# Patient Record
Sex: Female | Born: 1995 | Race: White | Hispanic: No | Marital: Single | Smoking: Never smoker
Health system: Southern US, Community
[De-identification: ages and names within clinical notes are randomized; demographics above are authoritative.]

## PROBLEM LIST (undated history)

## (undated) DIAGNOSIS — F429 Obsessive-compulsive disorder, unspecified: Secondary | ICD-10-CM

## (undated) DIAGNOSIS — D509 Iron deficiency anemia, unspecified: Secondary | ICD-10-CM

## (undated) DIAGNOSIS — F419 Anxiety disorder, unspecified: Secondary | ICD-10-CM

## (undated) DIAGNOSIS — E538 Deficiency of other specified B group vitamins: Secondary | ICD-10-CM

## (undated) DIAGNOSIS — G43109 Migraine with aura, not intractable, without status migrainosus: Secondary | ICD-10-CM

## (undated) DIAGNOSIS — C439 Malignant melanoma of skin, unspecified: Secondary | ICD-10-CM

## (undated) DIAGNOSIS — T7840XA Allergy, unspecified, initial encounter: Secondary | ICD-10-CM

## (undated) HISTORY — DX: Anxiety disorder, unspecified: F41.9

## (undated) HISTORY — DX: Migraine with aura, not intractable, without status migrainosus: G43.109

## (undated) HISTORY — DX: Obsessive-compulsive disorder, unspecified: F42.9

## (undated) HISTORY — DX: Allergy, unspecified, initial encounter: T78.40XA

## (undated) HISTORY — DX: Deficiency of other specified B group vitamins: E53.8

## (undated) HISTORY — DX: Iron deficiency anemia, unspecified: D50.9

---
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
## 2015-01-16 IMAGING — CR DG ABDOMEN 3V
1 series · 3 of 3 positions shown · non-contrast
Comparison: none

REASON FOR EXAM: nausea with eating
COMMENTS:

PROCEDURE:     DXR - DXR ABDOMEN COMPLETE  - September 02, 2013  [DATE]
RESULT:     Bowel gas pattern appears normal. No nephrolithiasis is
appreciated. There is no free air.

[Series 1: w abdomen upright · 0.14mm/px · 3 of 3 slices shown]
[im 1/3]
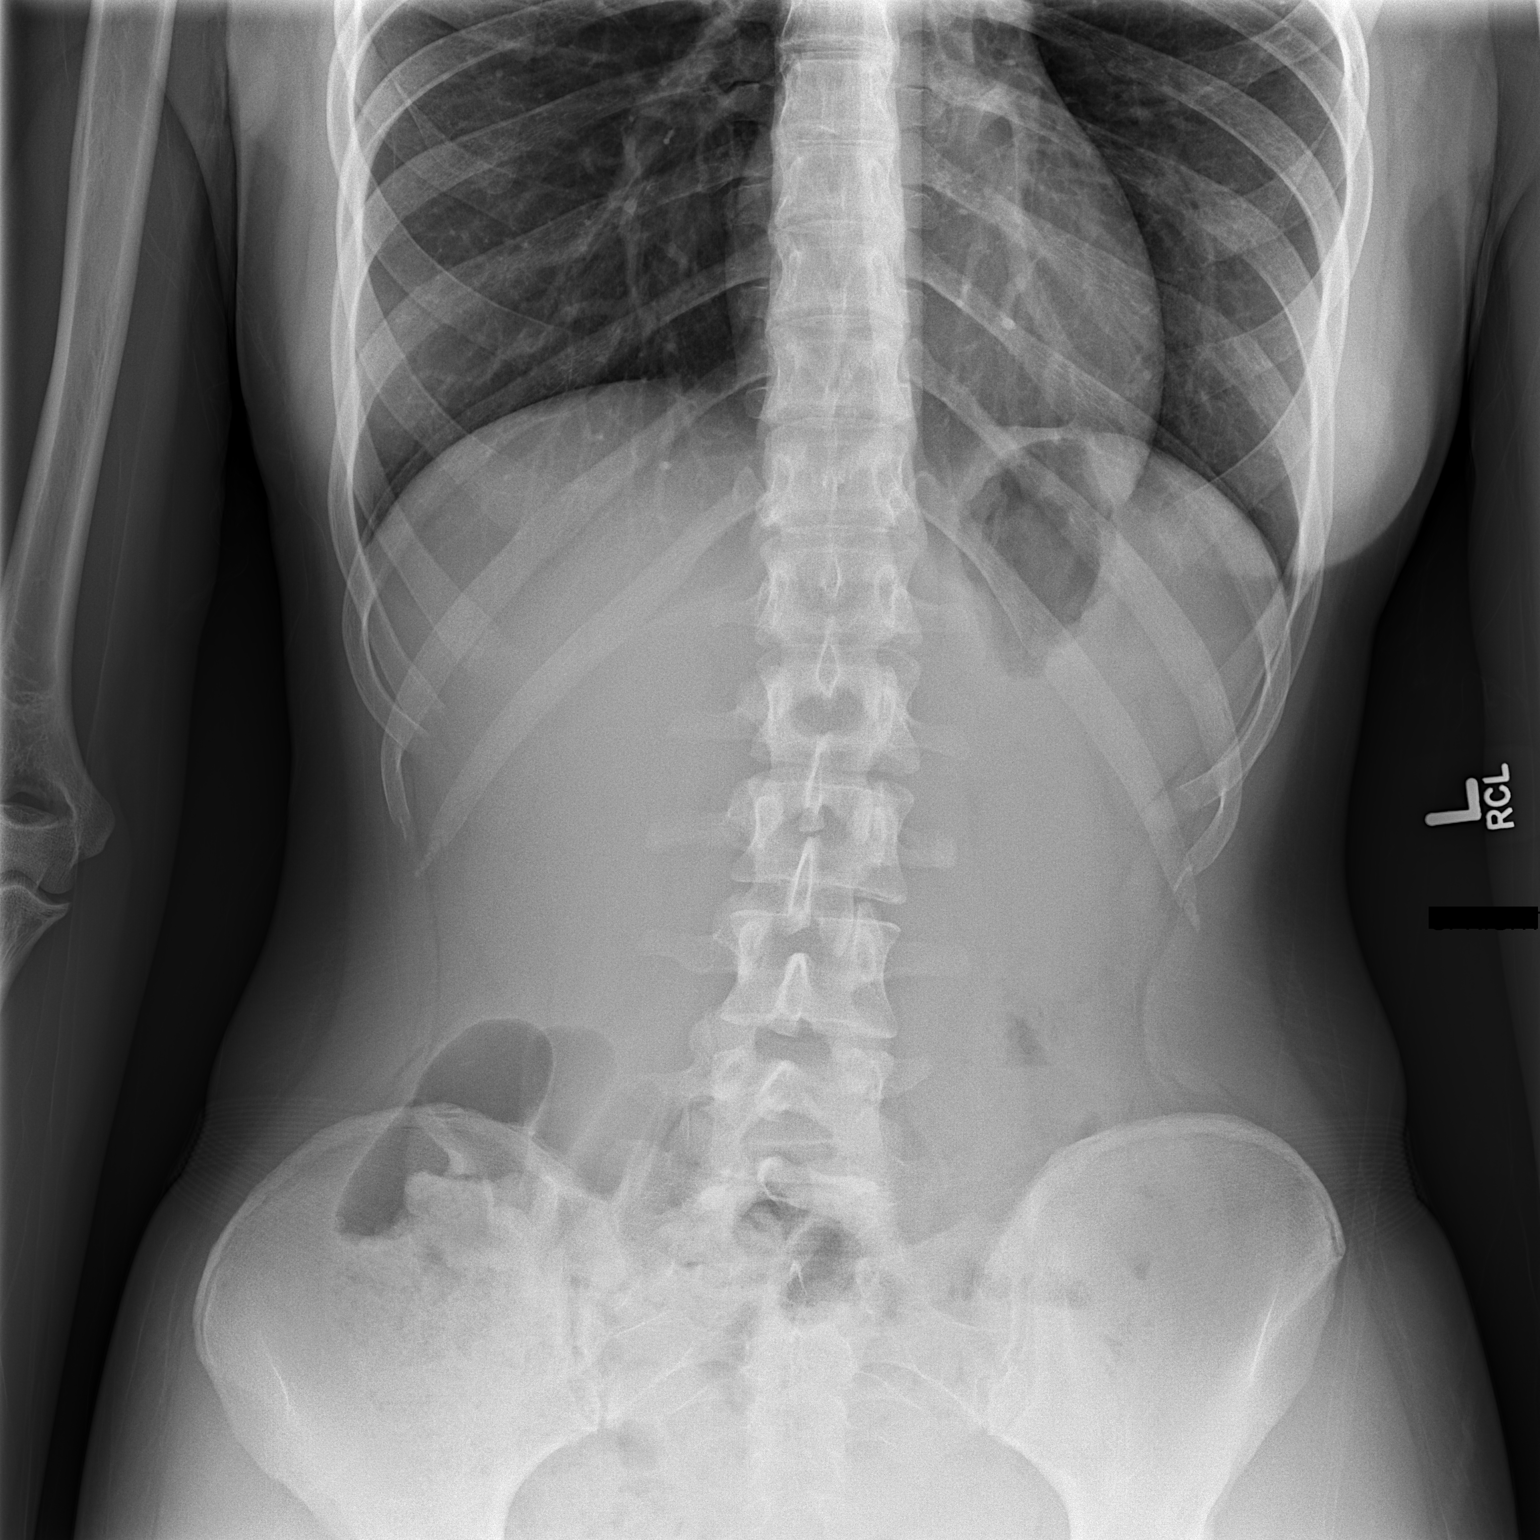
[im 2/3]
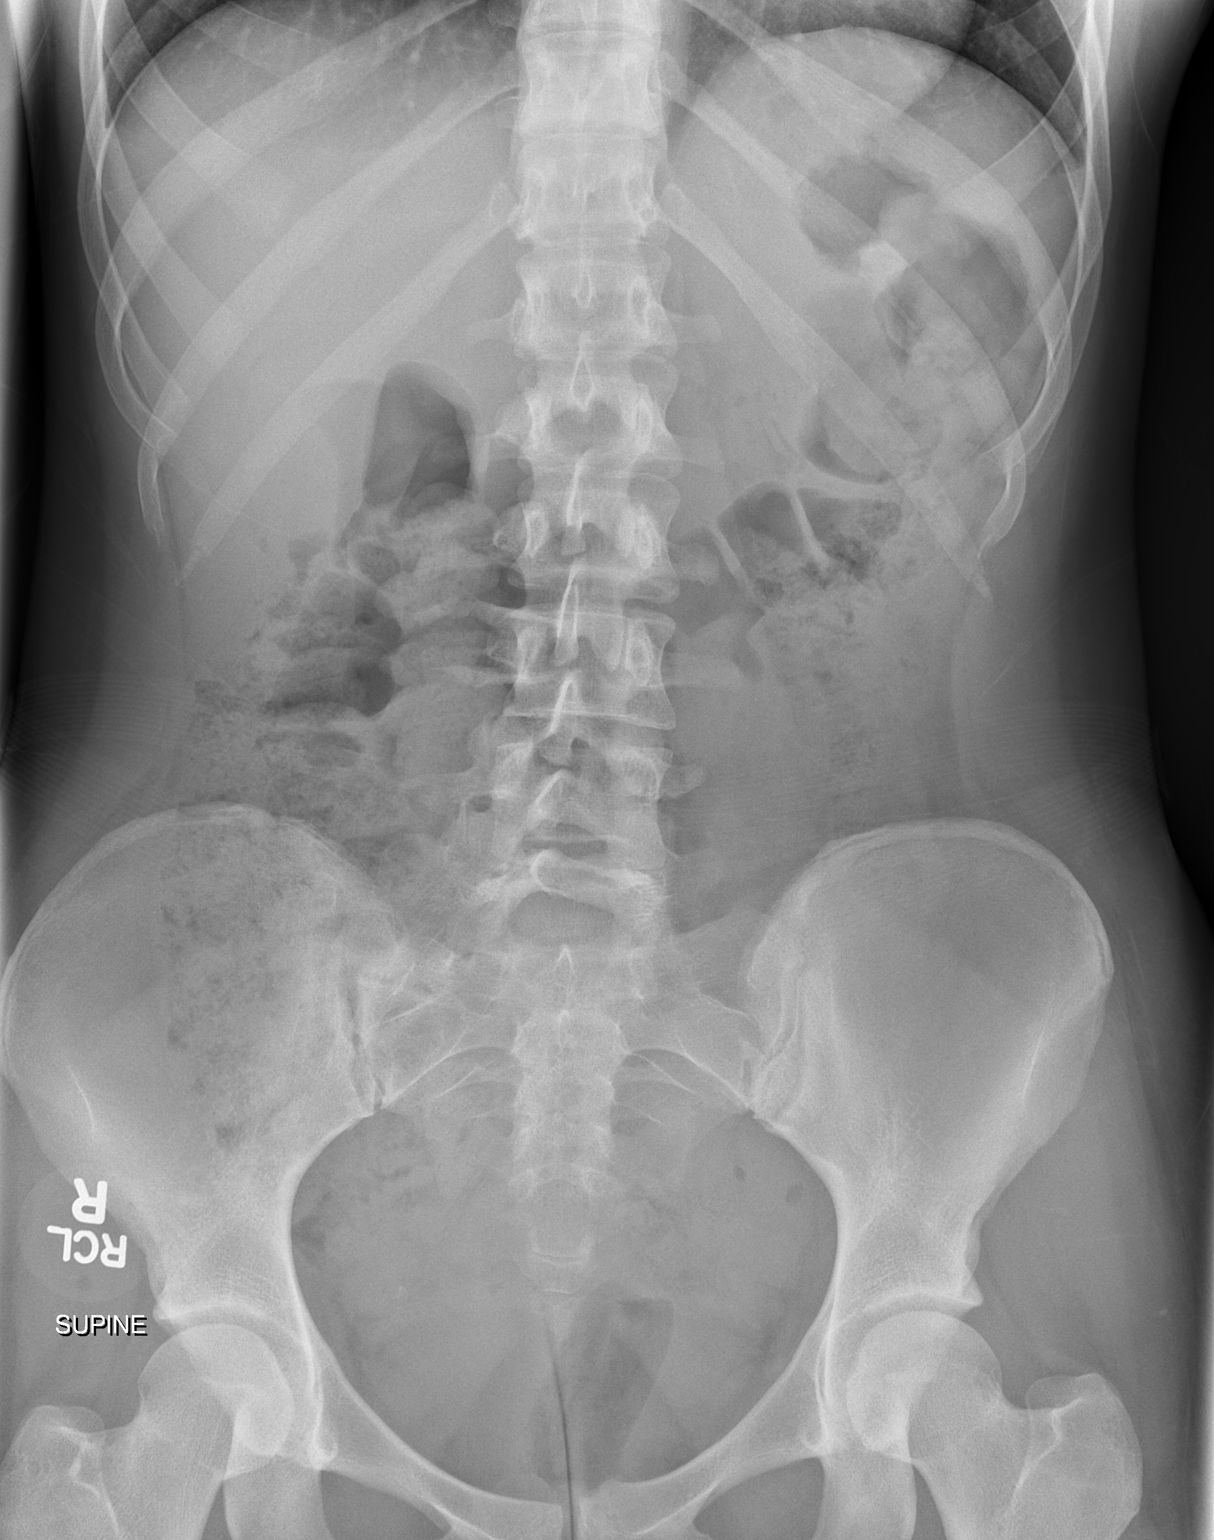
[im 3/3]
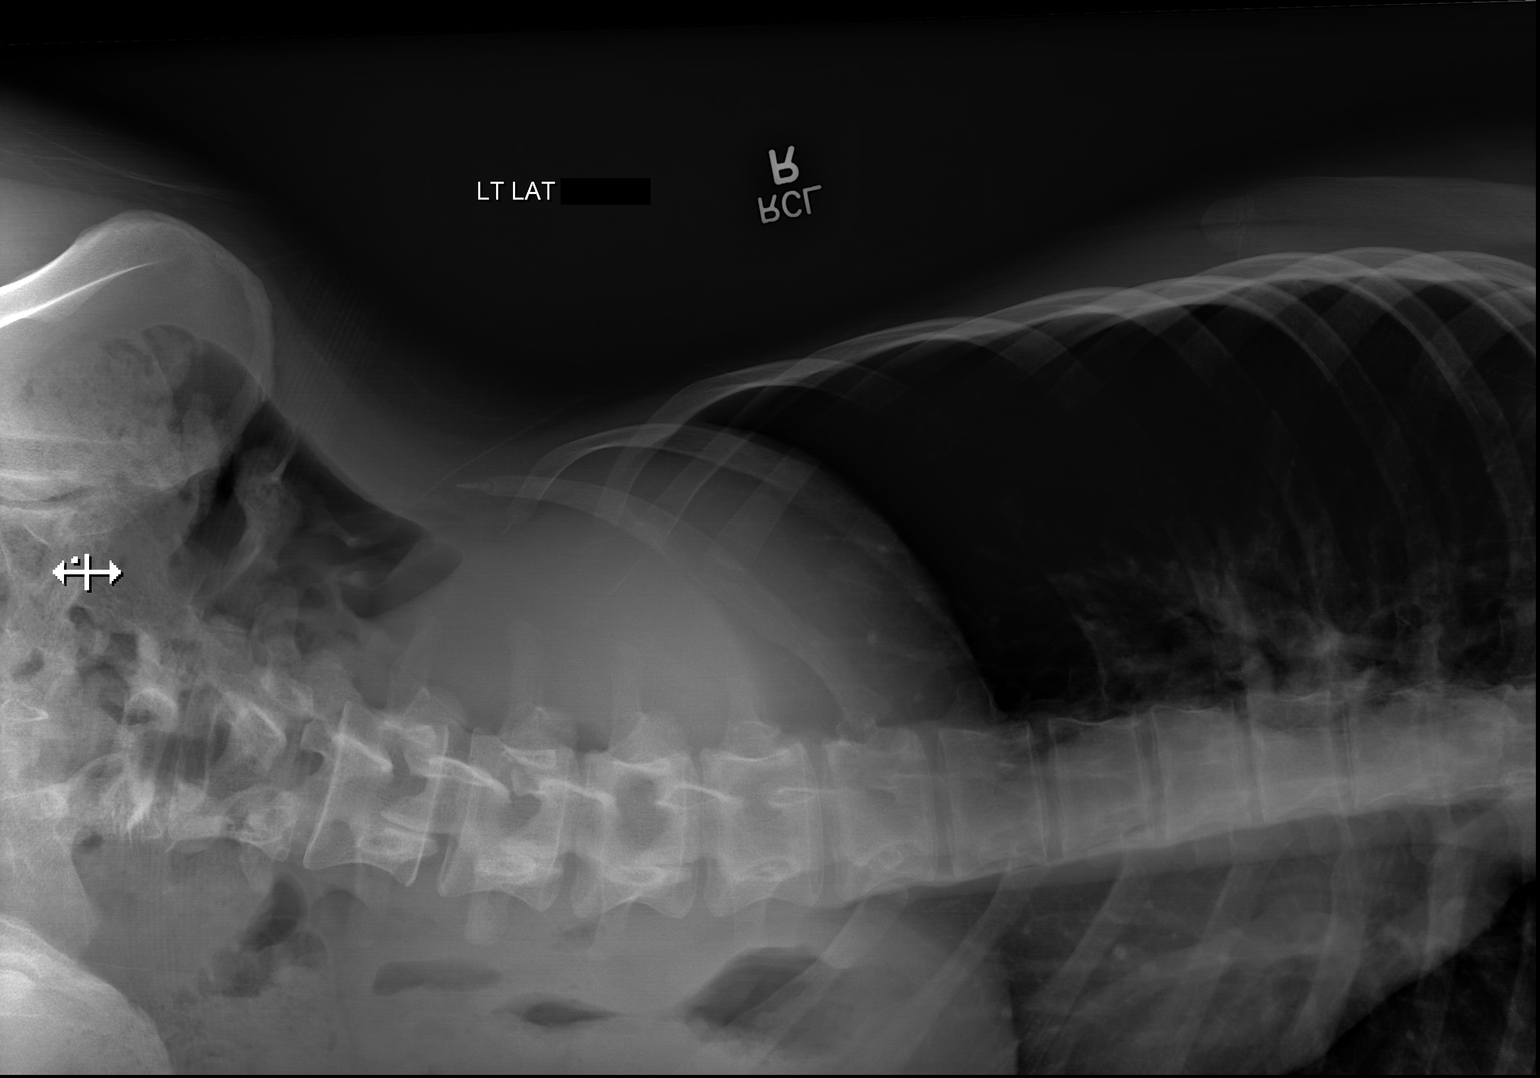

[3 of 3 positions shown; findings below may reference images not displayed]

IMPRESSION: No findings of bowel obstruction or perforation. The lung
bases are clear.

[REDACTED]

## 2016-10-10 DIAGNOSIS — L700 Acne vulgaris: Secondary | ICD-10-CM | POA: Diagnosis not present

## 2016-10-21 DIAGNOSIS — J029 Acute pharyngitis, unspecified: Secondary | ICD-10-CM | POA: Diagnosis not present

## 2016-10-21 DIAGNOSIS — J020 Streptococcal pharyngitis: Secondary | ICD-10-CM | POA: Diagnosis not present

## 2016-10-21 DIAGNOSIS — J019 Acute sinusitis, unspecified: Secondary | ICD-10-CM | POA: Diagnosis not present

## 2016-11-17 DIAGNOSIS — Z3042 Encounter for surveillance of injectable contraceptive: Secondary | ICD-10-CM | POA: Diagnosis not present

## 2017-01-15 DIAGNOSIS — M5413 Radiculopathy, cervicothoracic region: Secondary | ICD-10-CM | POA: Diagnosis not present

## 2017-01-15 DIAGNOSIS — M62838 Other muscle spasm: Secondary | ICD-10-CM | POA: Diagnosis not present

## 2017-01-15 DIAGNOSIS — M9901 Segmental and somatic dysfunction of cervical region: Secondary | ICD-10-CM | POA: Diagnosis not present

## 2017-01-16 DIAGNOSIS — M9901 Segmental and somatic dysfunction of cervical region: Secondary | ICD-10-CM | POA: Diagnosis not present

## 2017-01-16 DIAGNOSIS — M5413 Radiculopathy, cervicothoracic region: Secondary | ICD-10-CM | POA: Diagnosis not present

## 2017-01-16 DIAGNOSIS — M62838 Other muscle spasm: Secondary | ICD-10-CM | POA: Diagnosis not present

## 2017-01-19 DIAGNOSIS — M5413 Radiculopathy, cervicothoracic region: Secondary | ICD-10-CM | POA: Diagnosis not present

## 2017-01-19 DIAGNOSIS — M9901 Segmental and somatic dysfunction of cervical region: Secondary | ICD-10-CM | POA: Diagnosis not present

## 2017-01-19 DIAGNOSIS — M62838 Other muscle spasm: Secondary | ICD-10-CM | POA: Diagnosis not present

## 2017-01-21 DIAGNOSIS — M9901 Segmental and somatic dysfunction of cervical region: Secondary | ICD-10-CM | POA: Diagnosis not present

## 2017-01-21 DIAGNOSIS — M62838 Other muscle spasm: Secondary | ICD-10-CM | POA: Diagnosis not present

## 2017-01-21 DIAGNOSIS — M5413 Radiculopathy, cervicothoracic region: Secondary | ICD-10-CM | POA: Diagnosis not present

## 2017-02-03 DIAGNOSIS — J111 Influenza due to unidentified influenza virus with other respiratory manifestations: Secondary | ICD-10-CM | POA: Diagnosis not present

## 2017-03-19 DIAGNOSIS — R300 Dysuria: Secondary | ICD-10-CM | POA: Diagnosis not present

## 2017-05-12 DIAGNOSIS — Z30018 Encounter for initial prescription of other contraceptives: Secondary | ICD-10-CM | POA: Diagnosis not present

## 2017-05-14 DIAGNOSIS — J200 Acute bronchitis due to Mycoplasma pneumoniae: Secondary | ICD-10-CM | POA: Diagnosis not present

## 2017-05-14 DIAGNOSIS — J029 Acute pharyngitis, unspecified: Secondary | ICD-10-CM | POA: Diagnosis not present

## 2017-05-22 DIAGNOSIS — J019 Acute sinusitis, unspecified: Secondary | ICD-10-CM | POA: Diagnosis not present

## 2017-12-01 DIAGNOSIS — B001 Herpesviral vesicular dermatitis: Secondary | ICD-10-CM | POA: Diagnosis not present

## 2017-12-04 DIAGNOSIS — L700 Acne vulgaris: Secondary | ICD-10-CM | POA: Diagnosis not present

## 2017-12-10 DIAGNOSIS — Z6825 Body mass index (BMI) 25.0-25.9, adult: Secondary | ICD-10-CM | POA: Diagnosis not present

## 2017-12-10 DIAGNOSIS — Z118 Encounter for screening for other infectious and parasitic diseases: Secondary | ICD-10-CM | POA: Diagnosis not present

## 2017-12-10 DIAGNOSIS — Z01419 Encounter for gynecological examination (general) (routine) without abnormal findings: Secondary | ICD-10-CM | POA: Diagnosis not present

## 2017-12-10 DIAGNOSIS — Z23 Encounter for immunization: Secondary | ICD-10-CM | POA: Diagnosis not present

## 2018-05-18 DIAGNOSIS — D2339 Other benign neoplasm of skin of other parts of face: Secondary | ICD-10-CM | POA: Diagnosis not present

## 2018-05-18 DIAGNOSIS — S60031A Contusion of right middle finger without damage to nail, initial encounter: Secondary | ICD-10-CM | POA: Diagnosis not present

## 2018-05-18 DIAGNOSIS — L728 Other follicular cysts of the skin and subcutaneous tissue: Secondary | ICD-10-CM | POA: Diagnosis not present

## 2018-12-14 DIAGNOSIS — Z6823 Body mass index (BMI) 23.0-23.9, adult: Secondary | ICD-10-CM | POA: Diagnosis not present

## 2018-12-14 DIAGNOSIS — Z118 Encounter for screening for other infectious and parasitic diseases: Secondary | ICD-10-CM | POA: Diagnosis not present

## 2018-12-14 DIAGNOSIS — Z01419 Encounter for gynecological examination (general) (routine) without abnormal findings: Secondary | ICD-10-CM | POA: Diagnosis not present

## 2018-12-15 DIAGNOSIS — D485 Neoplasm of uncertain behavior of skin: Secondary | ICD-10-CM | POA: Diagnosis not present

## 2018-12-15 DIAGNOSIS — L700 Acne vulgaris: Secondary | ICD-10-CM | POA: Diagnosis not present

## 2018-12-15 DIAGNOSIS — R208 Other disturbances of skin sensation: Secondary | ICD-10-CM | POA: Diagnosis not present

## 2018-12-15 DIAGNOSIS — D0372 Melanoma in situ of left lower limb, including hip: Secondary | ICD-10-CM | POA: Diagnosis not present

## 2018-12-15 DIAGNOSIS — D2272 Melanocytic nevi of left lower limb, including hip: Secondary | ICD-10-CM | POA: Diagnosis not present

## 2019-01-26 DIAGNOSIS — D0372 Melanoma in situ of left lower limb, including hip: Secondary | ICD-10-CM | POA: Diagnosis not present

## 2019-04-19 DIAGNOSIS — D225 Melanocytic nevi of trunk: Secondary | ICD-10-CM | POA: Diagnosis not present

## 2019-04-19 DIAGNOSIS — S90112A Contusion of left great toe without damage to nail, initial encounter: Secondary | ICD-10-CM | POA: Diagnosis not present

## 2019-05-11 DIAGNOSIS — B360 Pityriasis versicolor: Secondary | ICD-10-CM | POA: Diagnosis not present

## 2019-05-11 DIAGNOSIS — Z08 Encounter for follow-up examination after completed treatment for malignant neoplasm: Secondary | ICD-10-CM | POA: Diagnosis not present

## 2019-05-11 DIAGNOSIS — Z85820 Personal history of malignant melanoma of skin: Secondary | ICD-10-CM | POA: Diagnosis not present

## 2019-05-11 DIAGNOSIS — D485 Neoplasm of uncertain behavior of skin: Secondary | ICD-10-CM | POA: Diagnosis not present

## 2019-05-11 DIAGNOSIS — D225 Melanocytic nevi of trunk: Secondary | ICD-10-CM | POA: Diagnosis not present

## 2019-05-19 DIAGNOSIS — F411 Generalized anxiety disorder: Secondary | ICD-10-CM | POA: Diagnosis not present

## 2019-05-24 DIAGNOSIS — F411 Generalized anxiety disorder: Secondary | ICD-10-CM | POA: Diagnosis not present

## 2019-05-25 DIAGNOSIS — D485 Neoplasm of uncertain behavior of skin: Secondary | ICD-10-CM | POA: Diagnosis not present

## 2019-06-02 DIAGNOSIS — F411 Generalized anxiety disorder: Secondary | ICD-10-CM | POA: Diagnosis not present

## 2019-06-21 DIAGNOSIS — F411 Generalized anxiety disorder: Secondary | ICD-10-CM | POA: Diagnosis not present

## 2019-06-22 DIAGNOSIS — Z6823 Body mass index (BMI) 23.0-23.9, adult: Secondary | ICD-10-CM | POA: Diagnosis not present

## 2019-06-22 DIAGNOSIS — Z713 Dietary counseling and surveillance: Secondary | ICD-10-CM | POA: Diagnosis not present

## 2019-06-22 DIAGNOSIS — Z0000 Encounter for general adult medical examination without abnormal findings: Secondary | ICD-10-CM | POA: Diagnosis not present

## 2019-06-22 DIAGNOSIS — Z23 Encounter for immunization: Secondary | ICD-10-CM | POA: Diagnosis not present

## 2019-06-28 DIAGNOSIS — Z23 Encounter for immunization: Secondary | ICD-10-CM | POA: Diagnosis not present

## 2019-11-14 ENCOUNTER — Other Ambulatory Visit: Payer: Self-pay

## 2019-11-14 DIAGNOSIS — Z20822 Contact with and (suspected) exposure to covid-19: Secondary | ICD-10-CM

## 2019-11-16 LAB — NOVEL CORONAVIRUS, NAA: SARS-CoV-2, NAA: NOT DETECTED

## 2020-05-01 DIAGNOSIS — F429 Obsessive-compulsive disorder, unspecified: Secondary | ICD-10-CM | POA: Diagnosis not present

## 2020-05-07 DIAGNOSIS — F422 Mixed obsessional thoughts and acts: Secondary | ICD-10-CM | POA: Diagnosis not present

## 2020-06-05 DIAGNOSIS — F429 Obsessive-compulsive disorder, unspecified: Secondary | ICD-10-CM | POA: Diagnosis not present

## 2020-06-19 DIAGNOSIS — Z111 Encounter for screening for respiratory tuberculosis: Secondary | ICD-10-CM | POA: Diagnosis not present

## 2020-06-21 DIAGNOSIS — Z111 Encounter for screening for respiratory tuberculosis: Secondary | ICD-10-CM | POA: Diagnosis not present

## 2020-06-22 DIAGNOSIS — L700 Acne vulgaris: Secondary | ICD-10-CM | POA: Diagnosis not present

## 2020-06-22 DIAGNOSIS — D225 Melanocytic nevi of trunk: Secondary | ICD-10-CM | POA: Diagnosis not present

## 2020-06-22 DIAGNOSIS — D2262 Melanocytic nevi of left upper limb, including shoulder: Secondary | ICD-10-CM | POA: Diagnosis not present

## 2020-06-22 DIAGNOSIS — Z85820 Personal history of malignant melanoma of skin: Secondary | ICD-10-CM | POA: Diagnosis not present

## 2020-06-26 DIAGNOSIS — Z111 Encounter for screening for respiratory tuberculosis: Secondary | ICD-10-CM | POA: Diagnosis not present

## 2020-06-27 DIAGNOSIS — F429 Obsessive-compulsive disorder, unspecified: Secondary | ICD-10-CM | POA: Diagnosis not present

## 2020-06-28 DIAGNOSIS — Z111 Encounter for screening for respiratory tuberculosis: Secondary | ICD-10-CM | POA: Diagnosis not present

## 2020-07-07 DIAGNOSIS — Z8659 Personal history of other mental and behavioral disorders: Secondary | ICD-10-CM | POA: Diagnosis not present

## 2020-07-07 DIAGNOSIS — R5383 Other fatigue: Secondary | ICD-10-CM | POA: Diagnosis not present

## 2020-07-20 DIAGNOSIS — H5213 Myopia, bilateral: Secondary | ICD-10-CM | POA: Diagnosis not present

## 2020-07-25 DIAGNOSIS — F422 Mixed obsessional thoughts and acts: Secondary | ICD-10-CM | POA: Diagnosis not present

## 2020-09-11 DIAGNOSIS — F422 Mixed obsessional thoughts and acts: Secondary | ICD-10-CM | POA: Diagnosis not present

## 2020-10-08 DIAGNOSIS — F429 Obsessive-compulsive disorder, unspecified: Secondary | ICD-10-CM | POA: Diagnosis not present

## 2020-10-15 DIAGNOSIS — Z23 Encounter for immunization: Secondary | ICD-10-CM | POA: Diagnosis not present

## 2020-10-18 DIAGNOSIS — F422 Mixed obsessional thoughts and acts: Secondary | ICD-10-CM | POA: Diagnosis not present

## 2020-11-08 DIAGNOSIS — F429 Obsessive-compulsive disorder, unspecified: Secondary | ICD-10-CM | POA: Diagnosis not present

## 2020-11-19 DIAGNOSIS — F422 Mixed obsessional thoughts and acts: Secondary | ICD-10-CM | POA: Diagnosis not present

## 2020-12-03 DIAGNOSIS — F429 Obsessive-compulsive disorder, unspecified: Secondary | ICD-10-CM | POA: Diagnosis not present

## 2020-12-13 DIAGNOSIS — F422 Mixed obsessional thoughts and acts: Secondary | ICD-10-CM | POA: Diagnosis not present

## 2020-12-18 DIAGNOSIS — Z01419 Encounter for gynecological examination (general) (routine) without abnormal findings: Secondary | ICD-10-CM | POA: Diagnosis not present

## 2020-12-18 DIAGNOSIS — Z6827 Body mass index (BMI) 27.0-27.9, adult: Secondary | ICD-10-CM | POA: Diagnosis not present

## 2020-12-18 DIAGNOSIS — Z124 Encounter for screening for malignant neoplasm of cervix: Secondary | ICD-10-CM | POA: Diagnosis not present

## 2021-01-04 DIAGNOSIS — Z85820 Personal history of malignant melanoma of skin: Secondary | ICD-10-CM | POA: Diagnosis not present

## 2021-01-04 DIAGNOSIS — B001 Herpesviral vesicular dermatitis: Secondary | ICD-10-CM | POA: Diagnosis not present

## 2021-01-04 DIAGNOSIS — D225 Melanocytic nevi of trunk: Secondary | ICD-10-CM | POA: Diagnosis not present

## 2021-01-04 DIAGNOSIS — L700 Acne vulgaris: Secondary | ICD-10-CM | POA: Diagnosis not present

## 2021-01-04 LAB — HM PAP SMEAR

## 2021-01-21 DIAGNOSIS — F422 Mixed obsessional thoughts and acts: Secondary | ICD-10-CM | POA: Diagnosis not present

## 2021-01-29 DIAGNOSIS — F429 Obsessive-compulsive disorder, unspecified: Secondary | ICD-10-CM | POA: Diagnosis not present

## 2021-03-06 DIAGNOSIS — F429 Obsessive-compulsive disorder, unspecified: Secondary | ICD-10-CM | POA: Diagnosis not present

## 2021-03-07 DIAGNOSIS — F422 Mixed obsessional thoughts and acts: Secondary | ICD-10-CM | POA: Diagnosis not present

## 2021-04-29 DIAGNOSIS — F429 Obsessive-compulsive disorder, unspecified: Secondary | ICD-10-CM | POA: Diagnosis not present

## 2021-05-02 DIAGNOSIS — F422 Mixed obsessional thoughts and acts: Secondary | ICD-10-CM | POA: Diagnosis not present

## 2021-06-25 ENCOUNTER — Ambulatory Visit
Admission: EM | Admit: 2021-06-25 | Discharge: 2021-06-25 | Disposition: A | Discharge status: Home / Self Care | Payer: BC Managed Care – PPO | Attending: Emergency Medicine | Admitting: Emergency Medicine

## 2021-06-25 ENCOUNTER — Other Ambulatory Visit: Payer: Self-pay

## 2021-06-25 ENCOUNTER — Encounter: Payer: Self-pay | Admitting: Emergency Medicine

## 2021-06-25 DIAGNOSIS — J029 Acute pharyngitis, unspecified: Secondary | ICD-10-CM | POA: Diagnosis not present

## 2021-06-25 DIAGNOSIS — J069 Acute upper respiratory infection, unspecified: Secondary | ICD-10-CM | POA: Diagnosis not present

## 2021-06-25 HISTORY — DX: Malignant melanoma of skin, unspecified: C43.9

## 2021-06-25 LAB — POCT RAPID STREP A (OFFICE): Rapid Strep A Screen: NEGATIVE

## 2021-06-25 NOTE — ED Triage Notes (Signed)
Pt presents today with c/o of headache, post nasal drip, sore throat x 3 days, no fever.

## 2021-06-25 NOTE — Discharge Instructions (Addendum)
Your rapid strep test is negative.  A throat culture is pending; we will call you if it is positive requiring treatment.    Your COVID test is pending.  You should self quarantine until the test result is back.    Take Tylenol or ibuprofen as needed for fever or discomfort.  Rest and keep yourself hydrated.    Follow-up with your primary care provider if your symptoms are not improving.

## 2021-06-25 NOTE — ED Provider Notes (Signed)
Roderic Palau    CSN: [id number] Arrival date & time: 06/25/21  1342      History   Chief Complaint Chief Complaint  Patient presents with   Otalgia   Headache   Sore Throat   Nasal Congestion    HPI Linda Chung is a 25 y.o. female.  Patient presents with 3-day history of headache, sore throat, postnasal drip, congestion.  She denies fever, chills, cough, shortness of breath, vomiting, diarrhea, or other symptoms.  OTC treatment attempted at home.  No pertinent medical history.  The history is provided by the patient.   Past Medical History:  Diagnosis Date   Melanoma ([place])     There are no problems to display for this patient.   History reviewed. No pertinent surgical history.  OB History   No obstetric history on file.      Home Medications    Prior to Admission medications   Medication Sig Start Date End Date Taking? Authorizing Provider  NEOMYCIN-POLYMYXIN-HYDROCORTISONE (CORTISPORIN) 1 % SOLN OTIC solution  09/06/10  Yes [provider]  acyclovir (ZOVIRAX) 400 MG tablet Take 400 mg by mouth 2 (two) times daily. 04/09/21   [provider]  sertraline (ZOLOFT) 50 MG tablet Take 50 mg by mouth daily. 06/06/21   [provider]  spironolactone (ALDACTONE) 50 MG tablet Take 50 mg by mouth 2 (two) times daily. 06/06/21   [provider]    Family History History reviewed. No pertinent family history.  Social History Social History   Tobacco Use   Smoking status: Never   Smokeless tobacco: Never  Vaping Use   Vaping Use: Never used  Substance Use Topics   Alcohol use: Not Currently    Comment: occ   Drug use: Not Currently     Allergies   Sulfa antibiotics   Review of Systems Review of Systems  Constitutional:  Negative for chills and fever.  HENT:  Positive for postnasal drip and sore throat. Negative for ear pain.   Respiratory:  Positive for cough. Negative for shortness of breath.    Cardiovascular:  Negative for chest pain and palpitations.  Gastrointestinal:  Negative for abdominal pain and vomiting.  Skin:  Negative for color change and rash.  Neurological:  Positive for headaches. Negative for syncope.  All other systems reviewed and are negative.   Physical Exam Triage Vital Signs ED Triage Vitals  Enc Vitals Group     BP      Pulse      Resp      Temp      Temp src      SpO2      Weight      Height      Head Circumference      Peak Flow      Pain Score      Pain Loc      Pain Edu?      Excl. in [place]?    No data found.  Updated Vital Signs BP 121/82 (BP Location: Right Arm)   Pulse 91   Temp 99.3 F (37.4 C) (Oral)   Resp 16   Ht 5\' 2"  (1.575 m)   Wt 150 lb (68 kg)   SpO2 99%   BMI 27.44 kg/m   Visual Acuity Right Eye Distance:   Left Eye Distance:   Bilateral Distance:    Right Eye Near:   Left Eye Near:    Bilateral Near:     Physical  Exam Vitals and nursing note reviewed.  Constitutional:      General: She is not in acute distress.    Appearance: She is well-developed. She is not ill-appearing.  HENT:     Head: Normocephalic and atraumatic.     Right Ear: Tympanic membrane normal.     Left Ear: Tympanic membrane normal.     Nose: Nose normal.     Mouth/Throat:     Mouth: Mucous membranes are moist.     Pharynx: Posterior oropharyngeal erythema present.  Eyes:     Conjunctiva/sclera: Conjunctivae normal.  Cardiovascular:     Rate and Rhythm: Normal rate and regular rhythm.     Heart sounds: Normal heart sounds.  Pulmonary:     Effort: Pulmonary effort is normal. No respiratory distress.     Breath sounds: Normal breath sounds.  Abdominal:     Palpations: Abdomen is soft.     Tenderness: There is no abdominal tenderness.  Musculoskeletal:     Cervical back: Neck supple.  Skin:    General: Skin is warm and dry.  Neurological:     General: No focal deficit present.     Mental Status: She is alert and oriented to  person, place, and time.     Gait: Gait normal.  Psychiatric:        Mood and Affect: Mood normal.        Behavior: Behavior normal.     UC Treatments / Results  Labs (all labs ordered are listed, but only abnormal results are displayed) Labs Reviewed  NOVEL CORONAVIRUS, NAA  CULTURE, GROUP A STREP [facility])  POCT RAPID STREP A (OFFICE)    EKG   Radiology No results found.  Procedures Procedures (including critical care time)  Medications Ordered in UC Medications - No data to display  Initial Impression / Assessment and Plan / UC Course  I have reviewed the triage vital signs and the nursing notes.  Pertinent labs & imaging results that were available during my care of the patient were reviewed by me and considered in my medical decision making (see chart for details).   Viral URI, sore throat.  Rapid strep negative; culture pending.  COVID pending.  Instructed patient to self quarantine per CDC guidelines.  Discussed symptomatic treatment including Tylenol or ibuprofen, rest, hydration.  Instructed patient to follow up with PCP if symptoms are not improving.  Patient agrees to plan of care.    Final Clinical Impressions(s) / UC Diagnoses   Final diagnoses:  Viral URI  Sore throat     Discharge Instructions      Your rapid strep test is negative.  A throat culture is pending; we will call you if it is positive requiring treatment.    Your COVID test is pending.  You should self quarantine until the test result is back.    Take Tylenol or ibuprofen as needed for fever or discomfort.  Rest and keep yourself hydrated.    Follow-up with your primary care provider if your symptoms are not improving.         ED Prescriptions   None    PDMP not reviewed this encounter.   Sharion Balloon, NP 06/25/21 1434

## 2021-06-26 DIAGNOSIS — H5213 Myopia, bilateral: Secondary | ICD-10-CM | POA: Diagnosis not present

## 2021-06-26 LAB — NOVEL CORONAVIRUS, NAA: SARS-CoV-2, NAA: NOT DETECTED

## 2021-06-26 LAB — SARS-COV-2, NAA 2 DAY TAT

## 2021-06-28 LAB — CULTURE, GROUP A STREP (THRC)

## 2021-07-05 DIAGNOSIS — D2262 Melanocytic nevi of left upper limb, including shoulder: Secondary | ICD-10-CM | POA: Diagnosis not present

## 2021-07-05 DIAGNOSIS — L304 Erythema intertrigo: Secondary | ICD-10-CM | POA: Diagnosis not present

## 2021-07-05 DIAGNOSIS — D225 Melanocytic nevi of trunk: Secondary | ICD-10-CM | POA: Diagnosis not present

## 2021-07-05 DIAGNOSIS — Z85820 Personal history of malignant melanoma of skin: Secondary | ICD-10-CM | POA: Diagnosis not present

## 2021-07-06 DIAGNOSIS — E611 Iron deficiency: Secondary | ICD-10-CM | POA: Diagnosis not present

## 2021-07-06 DIAGNOSIS — F419 Anxiety disorder, unspecified: Secondary | ICD-10-CM | POA: Diagnosis not present

## 2021-07-06 DIAGNOSIS — E538 Deficiency of other specified B group vitamins: Secondary | ICD-10-CM | POA: Diagnosis not present

## 2021-07-12 DIAGNOSIS — H02052 Trichiasis without entropian right lower eyelid: Secondary | ICD-10-CM | POA: Diagnosis not present

## 2021-10-14 ENCOUNTER — Ambulatory Visit: Payer: BC Managed Care – PPO | Admitting: Internal Medicine

## 2021-11-24 DIAGNOSIS — R059 Cough, unspecified: Secondary | ICD-10-CM | POA: Diagnosis not present

## 2021-11-24 DIAGNOSIS — B349 Viral infection, unspecified: Secondary | ICD-10-CM | POA: Diagnosis not present

## 2021-11-24 DIAGNOSIS — Z20822 Contact with and (suspected) exposure to covid-19: Secondary | ICD-10-CM | POA: Diagnosis not present

## 2021-11-24 DIAGNOSIS — R070 Pain in throat: Secondary | ICD-10-CM | POA: Diagnosis not present

## 2021-11-29 DIAGNOSIS — J029 Acute pharyngitis, unspecified: Secondary | ICD-10-CM | POA: Diagnosis not present

## 2021-11-29 DIAGNOSIS — J069 Acute upper respiratory infection, unspecified: Secondary | ICD-10-CM | POA: Diagnosis not present

## 2021-12-03 ENCOUNTER — Ambulatory Visit: Payer: Self-pay | Admitting: Internal Medicine

## 2021-12-14 DIAGNOSIS — H6692 Otitis media, unspecified, left ear: Secondary | ICD-10-CM | POA: Diagnosis not present

## 2022-01-06 DIAGNOSIS — Z118 Encounter for screening for other infectious and parasitic diseases: Secondary | ICD-10-CM | POA: Diagnosis not present

## 2022-01-06 DIAGNOSIS — Z01419 Encounter for gynecological examination (general) (routine) without abnormal findings: Secondary | ICD-10-CM | POA: Diagnosis not present

## 2022-01-06 DIAGNOSIS — Z3041 Encounter for surveillance of contraceptive pills: Secondary | ICD-10-CM | POA: Diagnosis not present

## 2022-01-06 DIAGNOSIS — Z6828 Body mass index (BMI) 28.0-28.9, adult: Secondary | ICD-10-CM | POA: Diagnosis not present

## 2022-01-08 DIAGNOSIS — D225 Melanocytic nevi of trunk: Secondary | ICD-10-CM | POA: Diagnosis not present

## 2022-01-08 DIAGNOSIS — L700 Acne vulgaris: Secondary | ICD-10-CM | POA: Diagnosis not present

## 2022-01-08 DIAGNOSIS — Z85820 Personal history of malignant melanoma of skin: Secondary | ICD-10-CM | POA: Diagnosis not present

## 2022-01-08 DIAGNOSIS — B001 Herpesviral vesicular dermatitis: Secondary | ICD-10-CM | POA: Diagnosis not present

## 2022-02-24 ENCOUNTER — Ambulatory Visit: Payer: Self-pay | Admitting: Internal Medicine

## 2022-03-18 DIAGNOSIS — F4321 Adjustment disorder with depressed mood: Secondary | ICD-10-CM | POA: Diagnosis not present

## 2022-03-24 ENCOUNTER — Encounter: Payer: Self-pay | Admitting: Internal Medicine

## 2022-03-24 ENCOUNTER — Ambulatory Visit: Payer: BC Managed Care – PPO | Admitting: Internal Medicine

## 2022-03-24 ENCOUNTER — Other Ambulatory Visit: Payer: Self-pay

## 2022-03-24 DIAGNOSIS — G43109 Migraine with aura, not intractable, without status migrainosus: Secondary | ICD-10-CM

## 2022-03-24 DIAGNOSIS — Z85820 Personal history of malignant melanoma of skin: Secondary | ICD-10-CM

## 2022-03-24 DIAGNOSIS — D509 Iron deficiency anemia, unspecified: Secondary | ICD-10-CM

## 2022-03-24 DIAGNOSIS — F419 Anxiety disorder, unspecified: Secondary | ICD-10-CM

## 2022-03-24 DIAGNOSIS — B001 Herpesviral vesicular dermatitis: Secondary | ICD-10-CM

## 2022-03-24 DIAGNOSIS — F429 Obsessive-compulsive disorder, unspecified: Secondary | ICD-10-CM

## 2022-03-24 DIAGNOSIS — E538 Deficiency of other specified B group vitamins: Secondary | ICD-10-CM

## 2022-03-24 NOTE — Progress Notes (Signed)
Patient ID: Linda Chung, female   DOB: [birth year], 26 y.o.   MRN: [id number] ? ? ?Subjective:  ? ? Patient ID: Linda Chung, female    DOB: 1996-02-27, 26 y.o.   MRN: [id number] ? ?This visit occurred during the SARS-CoV-2 public health emergency.  Safety protocols were in place, including screening questions prior to the visit, additional usage of staff PPE, and extensive cleaning of exam room while observing appropriate contact time as indicated for disinfecting solutions.  ? ?Patient here to establish care.  ? ?Chief Complaint  ?Patient presents with  ? Establish Care  ?  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 Meet and greet discuss about general health.   ? .  ? ?HPI ?History of OCD and anxiety.  On zoloft and doing well on this medication.  Biopsy 2019 - melanoma.  12/2018 - local excision s/p mohs procedure.  Followed by Dr Linda Chung q 6 months.  Has a history of occular migraines.  Stable.  Handling stress.  No chest pain or sob reported.  Tries to stay active.  Has a history of cold sores.  Takes valtrex.  No acid reflux.  No abdominal pain.  Bowels moving.  Continuous OCPs.  No period.  Sees Dr Linda Chung - [facility].  Last pap 11/2021.  Had questions about mild scoliosis.  States has been told had scoliosis previously.   ? ? ?Past Medical History:  ?Diagnosis Date  ? Anxiety   ? B12 deficiency   ? IDA (iron deficiency anemia)   ? Melanoma ([place])   ? OCD (obsessive compulsive disorder)   ? Ocular migraine   ? ?No past surgical history on file. ?No family history on file. ?Social History  ? ?Socioeconomic History  ? Marital status: Single  ?  Spouse name: Not on file  ? Number of children: Not on file  ? Years of education: Not on file  ? Highest education level: Not on file  ?Occupational History  ? Not on file  ?Tobacco Use  ? Smoking status: Never  ? Smokeless tobacco: Never  ?Vaping Use  ? Vaping Use: Never used  ?Substance and Sexual Activity  ? Alcohol use: Not Currently  ?  Comment: occ  ?  Drug use: Not Currently  ? Sexual activity: Not on file  ?Other Topics Concern  ? Not on file  ?Social History Narrative  ? Not on file  ? ?Social Determinants of Health  ? ?Financial Resource Strain: Not on file  ?Food Insecurity: Not on file  ?Transportation Needs: Not on file  ?Physical Activity: Not on file  ?Stress: Not on file  ?Social Connections: Not on file  ? ? ? ?Review of Systems  ?Constitutional:  Negative for appetite change and unexpected weight change.  ?HENT:  Negative for congestion and sinus pressure.   ?Respiratory:  Negative for cough, chest tightness and shortness of breath.   ?Cardiovascular:  Negative for chest pain, palpitations and leg swelling.  ?Gastrointestinal:  Negative for abdominal pain, diarrhea, nausea and vomiting.  ?Genitourinary:  Negative for difficulty urinating and dysuria.  ?Musculoskeletal:  Negative for joint swelling and myalgias.  ?Skin:  Negative for color change and rash.  ?Neurological:  Negative for dizziness, light-headedness and headaches.  ?Psychiatric/Behavioral:  Negative for agitation and dysphoric mood.   ? ?   ?Objective:  ?  ? ?BP 104/78 (BP Location: Left Arm, Patient Position: Sitting, Cuff Size: Normal)   Pulse 84   Temp 98.5 ?F (36.9 ?C) (  Oral)   Resp 14   Ht '5\' 3"'$  (1.6 m)   Wt 162 lb 6.4 oz (73.7 kg)   SpO2 98%   BMI 28.77 kg/m?  ?Wt Readings from Last 3 Encounters:  ?03/24/22 162 lb 6.4 oz (73.7 kg)  ?06/25/21 150 lb (68 kg)  ? ? ?Physical Exam ?Vitals reviewed.  ?Constitutional:   ?   General: She is not in acute distress. ?   Appearance: Normal appearance.  ?HENT:  ?   Head: Normocephalic and atraumatic.  ?   Right Ear: External ear normal.  ?   Left Ear: External ear normal.  ?Eyes:  ?   General: No scleral icterus.    ?   Right eye: No discharge.     ?   Left eye: No discharge.  ?   Conjunctiva/sclera: Conjunctivae normal.  ?Neck:  ?   Thyroid: No thyromegaly.  ?Cardiovascular:  ?   Rate and Rhythm: Normal rate and regular rhythm.   ?Pulmonary:  ?   Effort: No respiratory distress.  ?   Breath sounds: Normal breath sounds. No wheezing.  ?Abdominal:  ?   General: Bowel sounds are normal.  ?   Palpations: Abdomen is soft.  ?   Tenderness: There is no abdominal tenderness.  ?Musculoskeletal:     ?   General: No swelling or tenderness.  ?   Cervical back: Neck supple. No tenderness.  ?Lymphadenopathy:  ?   Cervical: No cervical adenopathy.  ?Skin: ?   Findings: No erythema or rash.  ?Neurological:  ?   Mental Status: She is alert.  ?Psychiatric:     ?   Mood and Affect: Mood normal.     ?   Behavior: Behavior normal.  ? ? ? ?Outpatient Encounter Medications as of 03/24/2022  ?Medication Sig  ? hydrOXYzine (ATARAX) 25 MG tablet Take 25 mg by mouth 2 (two)  times daily as needed for Anxiety (Anxiety/panic).  ? KAITLIB FE 0.8-25 MG-MCG tablet SMARTSIG:1 Tablet(s) By Mouth Daily  ? sertraline (ZOLOFT) 50 MG tablet Take 50 mg by mouth daily.  ? valACYclovir (VALTREX) 1000 MG tablet SMARTSIG:1 Tablet(s) By Mouth Daily  ? [DISCONTINUED] acyclovir (ZOVIRAX) 400 MG tablet Take 400 mg by mouth 2 (two) times daily. (Patient not taking: Reported on 03/24/2022)  ? [DISCONTINUED] NEOMYCIN-POLYMYXIN-HYDROCORTISONE (CORTISPORIN) 1 % SOLN OTIC solution  (Patient not taking: Reported on 03/24/2022)  ? [DISCONTINUED] spironolactone (ALDACTONE) 50 MG tablet Take 50 mg by mouth 2 (two) times daily. (Patient not taking: Reported on 03/24/2022)  ? ?No facility-administered encounter medications on file as of 03/24/2022.  ?  ? ?   ?Assessment & Plan:  ? ?Problem List Items Addressed This Visit   ? ? Anxiety  ?  Continues on zoloft.  Stable.  ?  ?  ? Relevant Medications  ? hydrOXYzine (ATARAX) 25 MG tablet  ? B12 deficiency  ?  Carries diagnosis of B12 def.  Follow.  ?  ?  ? History of melanoma  ?  Followed by Dr Linda Chung.  ?  ?  ? IDA (iron deficiency anemia)  ?  History of IDA.  Follow cbc and iron studies.  Currently not having period - on continuous OCPs.  Sees Dr Linda Chung  Linda Chung).  ?  ?  ? OCD (obsessive compulsive disorder)  ?  On zoloft and doing well.  Follow.  Continue zoloft.  ?  ?  ? Relevant Medications  ? hydrOXYzine (ATARAX) 25 MG tablet  ? Ocular migraine  ?  Has a history of occular migraines. Doing well.  Stable.  ?  ?  ? Recurrent cold sores  ?  On valtrex.  ?  ?  ? Relevant Medications  ? valACYclovir (VALTREX) 1000 MG tablet  ? ? ? ?Einar Pheasant, MD  ?

## 2022-03-30 ENCOUNTER — Encounter: Payer: Self-pay | Admitting: Internal Medicine

## 2022-03-30 DIAGNOSIS — F429 Obsessive-compulsive disorder, unspecified: Secondary | ICD-10-CM | POA: Insufficient documentation

## 2022-03-30 DIAGNOSIS — F419 Anxiety disorder, unspecified: Secondary | ICD-10-CM | POA: Insufficient documentation

## 2022-03-30 DIAGNOSIS — G43109 Migraine with aura, not intractable, without status migrainosus: Secondary | ICD-10-CM | POA: Insufficient documentation

## 2022-03-30 DIAGNOSIS — D509 Iron deficiency anemia, unspecified: Secondary | ICD-10-CM | POA: Insufficient documentation

## 2022-03-30 DIAGNOSIS — Z85820 Personal history of malignant melanoma of skin: Secondary | ICD-10-CM | POA: Insufficient documentation

## 2022-03-30 DIAGNOSIS — B001 Herpesviral vesicular dermatitis: Secondary | ICD-10-CM | POA: Insufficient documentation

## 2022-03-30 DIAGNOSIS — E538 Deficiency of other specified B group vitamins: Secondary | ICD-10-CM | POA: Insufficient documentation

## 2022-03-30 NOTE — Assessment & Plan Note (Signed)
Followed by Dr Isenstein.  

## 2022-03-30 NOTE — Assessment & Plan Note (Signed)
Continues on zoloft.  Stable.  

## 2022-03-30 NOTE — Assessment & Plan Note (Signed)
Carries diagnosis of B12 def.  Follow.  ?

## 2022-03-30 NOTE — Assessment & Plan Note (Signed)
Has a history of occular migraines. Doing well.  Stable.  ?

## 2022-03-30 NOTE — Assessment & Plan Note (Signed)
On valtrex.  ?

## 2022-03-30 NOTE — Assessment & Plan Note (Signed)
History of IDA.  Follow cbc and iron studies.  Currently not having period - on continuous OCPs.  Sees Dr Law ([facility]).  

## 2022-03-30 NOTE — Assessment & Plan Note (Signed)
On zoloft and doing well.  Follow.  Continue zoloft.  

## 2022-07-16 DIAGNOSIS — D485 Neoplasm of uncertain behavior of skin: Secondary | ICD-10-CM | POA: Diagnosis not present

## 2022-07-16 DIAGNOSIS — D2272 Melanocytic nevi of left lower limb, including hip: Secondary | ICD-10-CM | POA: Diagnosis not present

## 2022-07-16 DIAGNOSIS — D225 Melanocytic nevi of trunk: Secondary | ICD-10-CM | POA: Diagnosis not present

## 2022-07-16 DIAGNOSIS — Z85820 Personal history of malignant melanoma of skin: Secondary | ICD-10-CM | POA: Diagnosis not present

## 2022-07-16 DIAGNOSIS — D2262 Melanocytic nevi of left upper limb, including shoulder: Secondary | ICD-10-CM | POA: Diagnosis not present

## 2022-07-16 DIAGNOSIS — D2261 Melanocytic nevi of right upper limb, including shoulder: Secondary | ICD-10-CM | POA: Diagnosis not present

## 2022-08-20 DIAGNOSIS — L600 Ingrowing nail: Secondary | ICD-10-CM | POA: Diagnosis not present

## 2022-08-20 DIAGNOSIS — L03039 Cellulitis of unspecified toe: Secondary | ICD-10-CM | POA: Diagnosis not present

## 2022-09-17 DIAGNOSIS — D235 Other benign neoplasm of skin of trunk: Secondary | ICD-10-CM | POA: Diagnosis not present

## 2022-09-17 DIAGNOSIS — D485 Neoplasm of uncertain behavior of skin: Secondary | ICD-10-CM | POA: Diagnosis not present

## 2022-09-17 DIAGNOSIS — D225 Melanocytic nevi of trunk: Secondary | ICD-10-CM | POA: Diagnosis not present

## 2022-09-18 ENCOUNTER — Other Ambulatory Visit: Payer: Self-pay | Admitting: Internal Medicine

## 2022-09-19 ENCOUNTER — Other Ambulatory Visit: Payer: Self-pay

## 2022-09-19 MED ORDER — SERTRALINE HCL 50 MG PO TABS: 50.0000 mg | ORAL_TABLET | Freq: Every day | ORAL | 3 refills | Status: DC

## 2022-09-24 ENCOUNTER — Ambulatory Visit: Payer: BC Managed Care – PPO | Admitting: Internal Medicine

## 2022-09-24 ENCOUNTER — Other Ambulatory Visit: Payer: Self-pay | Admitting: Internal Medicine

## 2022-09-24 ENCOUNTER — Encounter: Payer: Self-pay | Admitting: Internal Medicine

## 2022-09-24 VITALS — BP 110/62 | HR 80 | Temp 98.3°F | Ht 63.0 in | Wt 176.4 lb

## 2022-09-24 DIAGNOSIS — Z85820 Personal history of malignant melanoma of skin: Secondary | ICD-10-CM

## 2022-09-24 DIAGNOSIS — F419 Anxiety disorder, unspecified: Secondary | ICD-10-CM

## 2022-09-24 DIAGNOSIS — D509 Iron deficiency anemia, unspecified: Secondary | ICD-10-CM | POA: Diagnosis not present

## 2022-09-24 DIAGNOSIS — Z9109 Other allergy status, other than to drugs and biological substances: Secondary | ICD-10-CM

## 2022-09-24 DIAGNOSIS — D72819 Decreased white blood cell count, unspecified: Secondary | ICD-10-CM

## 2022-09-24 LAB — CBC WITH DIFFERENTIAL/PLATELET
Basophils Absolute: 0 10*3/uL (ref 0.0–0.1)
Basophils Relative: 1.2 % (ref 0.0–3.0)
Eosinophils Absolute: 0 10*3/uL (ref 0.0–0.7)
Eosinophils Relative: 1 % (ref 0.0–5.0)
HCT: 38.4 % (ref 36.0–46.0)
Hemoglobin: 13.4 g/dL (ref 12.0–15.0)
Lymphocytes Relative: 52 % — ABNORMAL HIGH (ref 12.0–46.0)
Lymphs Abs: 2 10*3/uL (ref 0.7–4.0)
MCHC: 34.9 g/dL (ref 30.0–36.0)
MCV: 90 fl (ref 78.0–100.0)
Monocytes Absolute: 0.3 10*3/uL (ref 0.1–1.0)
Monocytes Relative: 9 % (ref 3.0–12.0)
Neutro Abs: 1.4 10*3/uL (ref 1.4–7.7)
Neutrophils Relative %: 36.8 % — ABNORMAL LOW (ref 43.0–77.0)
Platelets: 268 10*3/uL (ref 150.0–400.0)
RBC: 4.26 Mil/uL (ref 3.87–5.11)
RDW: 12.2 % (ref 11.5–15.5)
WBC: 3.9 10*3/uL — ABNORMAL LOW (ref 4.0–10.5)

## 2022-09-24 LAB — TSH: TSH: 1.51 u[IU]/mL (ref 0.35–5.50)

## 2022-09-24 LAB — COMPREHENSIVE METABOLIC PANEL
ALT: 14 U/L (ref 0–35)
AST: 16 U/L (ref 0–37)
Albumin: 4 g/dL (ref 3.5–5.2)
Alkaline Phosphatase: 22 U/L — ABNORMAL LOW (ref 39–117)
BUN: 15 mg/dL (ref 6–23)
CO2: 24 mEq/L (ref 19–32)
Calcium: 9.1 mg/dL (ref 8.4–10.5)
Chloride: 106 mEq/L (ref 96–112)
Creatinine, Ser: 0.77 mg/dL (ref 0.40–1.20)
GFR: 106.78 mL/min (ref >60.00)
Glucose, Bld: 88 mg/dL (ref 70–99)
Potassium: 3.9 mEq/L (ref 3.5–5.1)
Sodium: 138 mEq/L (ref 135–145)
Total Bilirubin: 0.4 mg/dL (ref 0.2–1.2)
Total Protein: 6.7 g/dL (ref 6.0–8.3)

## 2022-09-24 LAB — FERRITIN: Ferritin: 18.6 ng/mL (ref 10.0–291.0)

## 2022-09-24 MED ORDER — NYSTATIN 100000 UNIT/GM EX CREA: 1.0000 | TOPICAL_CREAM | Freq: Two times a day (BID) | CUTANEOUS | 0 refills | Status: DC

## 2022-09-24 NOTE — Assessment & Plan Note (Signed)
Continues on zoloft.  Stable.  

## 2022-09-24 NOTE — Assessment & Plan Note (Signed)
Followed by Dr Kellie Moor.
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
## 2022-09-24 NOTE — Progress Notes (Signed)
Order placed for f/u cbc.   

## 2022-09-24 NOTE — Assessment & Plan Note (Signed)
claritin and flonase - helping.  Follow

## 2022-09-24 NOTE — Assessment & Plan Note (Signed)
History of IDA.  Follow cbc and iron studies.  Currently not having period - on continuous OCPs.  Sees Dr Lanny Cramp [name]).
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
## 2022-11-03 ENCOUNTER — Other Ambulatory Visit: Payer: BC Managed Care – PPO

## 2022-11-03 ENCOUNTER — Telehealth: Payer: Self-pay | Admitting: Internal Medicine

## 2022-11-03 ENCOUNTER — Telehealth: Payer: Self-pay

## 2022-11-03 DIAGNOSIS — D72819 Decreased white blood cell count, unspecified: Secondary | ICD-10-CM

## 2022-11-03 DIAGNOSIS — D509 Iron deficiency anemia, unspecified: Secondary | ICD-10-CM

## 2022-11-03 NOTE — Telephone Encounter (Signed)
Send labs to [facility] patient lives in [place]

## 2022-11-03 NOTE — Telephone Encounter (Signed)
Cbc order sent to [facility]

## 2022-11-03 NOTE — Telephone Encounter (Signed)
Cbc orders sent to [facility] as per pt request

## 2022-11-04 ENCOUNTER — Telehealth: Payer: Self-pay

## 2022-11-04 NOTE — Telephone Encounter (Signed)
Pt called stating that she has been using the nystatin cream as prescribed but has not seen much changes since using cream. Pt was instructed by PCP to callback if cream hasn't helped. Pt requesting callback......Marland Kitchen

## 2022-11-04 NOTE — Telephone Encounter (Signed)
Pt sched for f/u Friday at 330

## 2022-11-07 ENCOUNTER — Encounter: Payer: Self-pay | Admitting: Internal Medicine

## 2022-11-07 ENCOUNTER — Ambulatory Visit: Payer: BC Managed Care – PPO | Admitting: Internal Medicine

## 2022-11-07 VITALS — BP 128/88 | HR 65 | Temp 98.0°F | Resp 14 | Ht 63.0 in | Wt 181.2 lb

## 2022-11-07 DIAGNOSIS — D509 Iron deficiency anemia, unspecified: Secondary | ICD-10-CM | POA: Diagnosis not present

## 2022-11-07 DIAGNOSIS — F429 Obsessive-compulsive disorder, unspecified: Secondary | ICD-10-CM

## 2022-11-07 DIAGNOSIS — Z85820 Personal history of malignant melanoma of skin: Secondary | ICD-10-CM

## 2022-11-07 DIAGNOSIS — R238 Other skin changes: Secondary | ICD-10-CM | POA: Diagnosis not present

## 2022-11-07 DIAGNOSIS — F419 Anxiety disorder, unspecified: Secondary | ICD-10-CM | POA: Diagnosis not present

## 2022-11-07 DIAGNOSIS — L309 Dermatitis, unspecified: Secondary | ICD-10-CM | POA: Diagnosis not present

## 2022-11-07 DIAGNOSIS — D1801 Hemangioma of skin and subcutaneous tissue: Secondary | ICD-10-CM | POA: Diagnosis not present

## 2022-11-07 LAB — CBC WITH DIFFERENTIAL/PLATELET
Absolute Monocytes: 467 cells/uL (ref 200–950)
Basophils Absolute: 68 cells/uL (ref 0–200)
Basophils Relative: 1.2 %
Eosinophils Absolute: 51 cells/uL (ref 15–500)
Eosinophils Relative: 0.9 %
HCT: 39.9 % (ref 35.0–45.0)
Hemoglobin: 13.7 g/dL (ref 11.7–15.5)
Lymphs Abs: 3021 cells/uL (ref 850–3900)
MCH: 30.9 pg (ref 27.0–33.0)
MCHC: 34.3 g/dL (ref 32.0–36.0)
MCV: 89.9 fL (ref 80.0–100.0)
MPV: 10.3 fL (ref 7.5–12.5)
Monocytes Relative: 8.2 %
Neutro Abs: 2092 cells/uL (ref 1500–7800)
Neutrophils Relative %: 36.7 %
Platelets: 322 10*3/uL (ref 140–400)
RBC: 4.44 10*6/uL (ref 3.80–5.10)
RDW: 11.5 % (ref 11.0–15.0)
Total Lymphocyte: 53 %
WBC: 5.7 10*3/uL (ref 3.8–10.8)

## 2022-11-07 NOTE — Progress Notes (Unsigned)
Patient ID: ERIONNA STRUM, female   DOB: June 20, 1996, 26 y.o.   MRN: [id number]   Subjective:    Patient ID: Ane Payment, female    DOB: 10/06/1996, 26 y.o.   MRN: [id number]  This visit occurred during the SARS-CoV-2 public health emergency.  Safety protocols were in place, including screening questions prior to the visit, additional usage of staff PPE, and extensive cleaning of exam room while observing appropriate contact time as indicated for disinfecting solutions.   Patient here for  Chief Complaint  Patient presents with   Follow-up   .   HPI    Past Medical History:  Diagnosis Date   Anxiety    B12 deficiency    IDA (iron deficiency anemia)    Melanoma ([facility])    OCD (obsessive compulsive disorder)    Ocular migraine    No past surgical history on file. No family history on file. Social History   Socioeconomic History   Marital status: Single    Spouse name: Not on file   Number of children: Not on file   Years of education: Not on file   Highest education level: Not on file  Occupational History   Not on file  Tobacco Use   Smoking status: Never   Smokeless tobacco: Never  Vaping Use   Vaping Use: Never used  Substance and Sexual Activity   Alcohol use: Not Currently    Comment: occ   Drug use: Not Currently   Sexual activity: Not on file  Other Topics Concern   Not on file  Social History Narrative   Not on file   Social Determinants of Health   Financial Resource Strain: Not on file  Food Insecurity: Not on file  Transportation Needs: Not on file  Physical Activity: Not on file  Stress: Not on file  Social Connections: Not on file     Review of Systems     Objective:     BP 128/88 (BP Location: Left Arm, Patient Position: Sitting, Cuff Size: Large)   Pulse 65   Temp 98 F (36.7 C) (Temporal)   Resp 14   Ht '5\' 3"'$  (1.6 m)   Wt 181 lb 3.2 oz (82.2 kg)   SpO2 98%   BMI 32.10 kg/m  Wt Readings from Last 3 Encounters:   11/07/22 181 lb 3.2 oz (82.2 kg)  09/24/22 176 lb 6.4 oz (80 kg)  03/24/22 162 lb 6.4 oz (73.7 kg)    Physical Exam   Outpatient Encounter Medications as of 11/07/2022  Medication Sig   hydrOXYzine (ATARAX) 25 MG tablet Take 25 mg by mouth 2 (two)  times daily as needed for Anxiety (Anxiety/panic).   KAITLIB FE 0.8-25 MG-MCG tablet SMARTSIG:1 Tablet(s) By Mouth Daily   nystatin cream (MYCOSTATIN) Apply 1 Application topically 2 (two) times daily.   sertraline (ZOLOFT) 50 MG tablet Take 1 tablet (50 mg total) by mouth daily.   valACYclovir (VALTREX) 1000 MG tablet SMARTSIG:1 Tablet(s) By Mouth Daily   No facility-administered encounter medications on file as of 11/07/2022.     Lab Results  Component Value Date   WBC 3.9 (L) 09/24/2022   HGB 13.4 09/24/2022   HCT 38.4 09/24/2022   PLT 268.0 09/24/2022   GLUCOSE 88 09/24/2022   ALT 14 09/24/2022   AST 16 09/24/2022   NA 138 09/24/2022   K 3.9 09/24/2022   CL 106 09/24/2022   CREATININE 0.77 09/24/2022   BUN 15 09/24/2022   CO2 24  09/24/2022   TSH 1.51 09/24/2022       Assessment & Plan:   Problem List Items Addressed This Visit     IDA (iron deficiency anemia) - Primary     Einar Pheasant, MD

## 2022-11-08 ENCOUNTER — Encounter: Payer: Self-pay | Admitting: Internal Medicine

## 2022-11-08 DIAGNOSIS — R238 Other skin changes: Secondary | ICD-10-CM | POA: Insufficient documentation

## 2022-11-08 NOTE — Assessment & Plan Note (Signed)
On zoloft and doing well.  Follow.  Continue zoloft.

## 2022-11-08 NOTE — Assessment & Plan Note (Signed)
Followed by Dr Kellie Moor.  Evaluated today.

## 2022-11-08 NOTE — Assessment & Plan Note (Signed)
Continues on zoloft.  Stable.

## 2022-11-08 NOTE — Assessment & Plan Note (Signed)
No increased inflammation.  Follow.

## 2023-01-19 DIAGNOSIS — Z01419 Encounter for gynecological examination (general) (routine) without abnormal findings: Secondary | ICD-10-CM | POA: Diagnosis not present

## 2023-01-19 DIAGNOSIS — Z6833 Body mass index (BMI) 33.0-33.9, adult: Secondary | ICD-10-CM | POA: Diagnosis not present

## 2023-01-19 DIAGNOSIS — Z1371 Encounter for nonprocreative screening for genetic disease carrier status: Secondary | ICD-10-CM | POA: Diagnosis not present

## 2023-01-28 DIAGNOSIS — D2261 Melanocytic nevi of right upper limb, including shoulder: Secondary | ICD-10-CM | POA: Diagnosis not present

## 2023-01-28 DIAGNOSIS — D2262 Melanocytic nevi of left upper limb, including shoulder: Secondary | ICD-10-CM | POA: Diagnosis not present

## 2023-01-28 DIAGNOSIS — D225 Melanocytic nevi of trunk: Secondary | ICD-10-CM | POA: Diagnosis not present

## 2023-01-28 DIAGNOSIS — Z85820 Personal history of malignant melanoma of skin: Secondary | ICD-10-CM | POA: Diagnosis not present

## 2023-02-12 DIAGNOSIS — Z803 Family history of malignant neoplasm of breast: Secondary | ICD-10-CM | POA: Diagnosis not present

## 2023-02-12 DIAGNOSIS — Z7183 Encounter for nonprocreative genetic counseling: Secondary | ICD-10-CM | POA: Diagnosis not present

## 2023-02-12 DIAGNOSIS — Z1501 Genetic susceptibility to malignant neoplasm of breast: Secondary | ICD-10-CM | POA: Diagnosis not present

## 2023-02-15 ENCOUNTER — Encounter: Payer: Self-pay | Admitting: Internal Medicine

## 2023-02-17 NOTE — Telephone Encounter (Signed)
Printed and placed in results folder

## 2023-03-10 DIAGNOSIS — M542 Cervicalgia: Secondary | ICD-10-CM | POA: Diagnosis not present

## 2023-03-16 DIAGNOSIS — M542 Cervicalgia: Secondary | ICD-10-CM | POA: Diagnosis not present

## 2023-03-16 DIAGNOSIS — M25552 Pain in left hip: Secondary | ICD-10-CM | POA: Diagnosis not present

## 2023-03-20 DIAGNOSIS — M542 Cervicalgia: Secondary | ICD-10-CM | POA: Diagnosis not present

## 2023-03-20 DIAGNOSIS — M25552 Pain in left hip: Secondary | ICD-10-CM | POA: Diagnosis not present

## 2023-03-23 DIAGNOSIS — M542 Cervicalgia: Secondary | ICD-10-CM | POA: Diagnosis not present

## 2023-03-23 DIAGNOSIS — M25552 Pain in left hip: Secondary | ICD-10-CM | POA: Diagnosis not present

## 2023-03-26 ENCOUNTER — Ambulatory Visit: Payer: BC Managed Care – PPO | Admitting: Internal Medicine

## 2023-03-26 VITALS — BP 120/70 | HR 71 | Temp 98.0°F | Resp 16 | Ht 63.0 in | Wt 185.0 lb

## 2023-03-26 DIAGNOSIS — D509 Iron deficiency anemia, unspecified: Secondary | ICD-10-CM

## 2023-03-26 DIAGNOSIS — G43109 Migraine with aura, not intractable, without status migrainosus: Secondary | ICD-10-CM | POA: Diagnosis not present

## 2023-03-26 DIAGNOSIS — F419 Anxiety disorder, unspecified: Secondary | ICD-10-CM | POA: Diagnosis not present

## 2023-03-26 DIAGNOSIS — R000 Tachycardia, unspecified: Secondary | ICD-10-CM | POA: Insufficient documentation

## 2023-03-26 DIAGNOSIS — E282 Polycystic ovarian syndrome: Secondary | ICD-10-CM

## 2023-03-26 DIAGNOSIS — F429 Obsessive-compulsive disorder, unspecified: Secondary | ICD-10-CM

## 2023-03-26 DIAGNOSIS — Z1379 Encounter for other screening for genetic and chromosomal anomalies: Secondary | ICD-10-CM

## 2023-03-26 LAB — COMPREHENSIVE METABOLIC PANEL
ALT: 16 U/L (ref 0–35)
AST: 18 U/L (ref 0–37)
Albumin: 4.1 g/dL (ref 3.5–5.2)
Alkaline Phosphatase: 23 U/L — ABNORMAL LOW (ref 39–117)
BUN: 15 mg/dL (ref 6–23)
CO2: 25 mEq/L (ref 19–32)
Calcium: 9.3 mg/dL (ref 8.4–10.5)
Chloride: 105 mEq/L (ref 96–112)
Creatinine, Ser: 0.75 mg/dL (ref 0.40–1.20)
GFR: 109.82 mL/min (ref >60.00)
Glucose, Bld: 94 mg/dL (ref 70–99)
Potassium: 4.2 mEq/L (ref 3.5–5.1)
Sodium: 138 mEq/L (ref 135–145)
Total Bilirubin: 0.4 mg/dL (ref 0.2–1.2)
Total Protein: 6.8 g/dL (ref 6.0–8.3)

## 2023-03-26 LAB — CBC WITH DIFFERENTIAL/PLATELET
Basophils Absolute: 0.1 10*3/uL (ref 0.0–0.1)
Basophils Relative: 1.9 % (ref 0.0–3.0)
Eosinophils Absolute: 0 10*3/uL (ref 0.0–0.7)
Eosinophils Relative: 1 % (ref 0.0–5.0)
HCT: 41.7 % (ref 36.0–46.0)
Hemoglobin: 14.2 g/dL (ref 12.0–15.0)
Lymphocytes Relative: 45.7 % (ref 12.0–46.0)
Lymphs Abs: 2 10*3/uL (ref 0.7–4.0)
MCHC: 34 g/dL (ref 30.0–36.0)
MCV: 89.7 fl (ref 78.0–100.0)
Monocytes Absolute: 0.4 10*3/uL (ref 0.1–1.0)
Monocytes Relative: 7.9 % (ref 3.0–12.0)
Neutro Abs: 1.9 10*3/uL (ref 1.4–7.7)
Neutrophils Relative %: 43.5 % (ref 43.0–77.0)
Platelets: 315 10*3/uL (ref 150.0–400.0)
RBC: 4.65 Mil/uL (ref 3.87–5.11)
RDW: 12.2 % (ref 11.5–15.5)
WBC: 4.5 10*3/uL (ref 4.0–10.5)

## 2023-03-26 LAB — LIPID PANEL
Cholesterol: 191 mg/dL (ref 0–200)
HDL: 66.3 mg/dL (ref >39.00)
LDL Cholesterol: 94 mg/dL (ref 0–99)
NonHDL: 125.12
Total CHOL/HDL Ratio: 3
Triglycerides: 158 mg/dL — ABNORMAL HIGH (ref 0.0–149.0)
VLDL: 31.6 mg/dL (ref 0.0–40.0)

## 2023-03-26 LAB — TSH: TSH: 1.12 u[IU]/mL (ref 0.35–5.50)

## 2023-03-26 MED ORDER — SERTRALINE HCL 100 MG PO TABS: 100.0000 mg | ORAL_TABLET | Freq: Every day | ORAL | 3 refills | Status: DC

## 2023-03-26 NOTE — Progress Notes (Signed)
Subjective:    Patient ID: Linda Chung, female    DOB: 14-Jun-1996, 27 y.o.   MRN: [id number]  Patient here for  Chief Complaint  Patient presents with   Medical Management of Chronic Issues    HPI Here to follow up regarding increased anxiety/OCD.  On zoloft. Feels the zoloft helps some, but has noticed some increased issues with anxiety and OCD.  Discussed adjusting dose of zoloft.  Tries to stay active.  No chest pain or sob reported.  Has noticed that her heart rate runs a little higher - some episodes of increased heart rate.  Discussed caffeine intake and limiting coffee intake and intake of energy drinks.  Also discussed that treating increased anxiety may help with this as well.  Discussed staying hydrated.  Also on spironolactone now.  Will check electrolytes and kidney function.  Eating.  No nausea or vomiting.  No bowel change reported.  PT for her neck.  Due to see Dr Donne Hazel next month to f/u regarding recent genetic testing.     Past Medical History:  Diagnosis Date   Anxiety    B12 deficiency    IDA (iron deficiency anemia)    Melanoma (HCC)    OCD (obsessive compulsive disorder)    Ocular migraine    History reviewed. No pertinent surgical history. History reviewed. No pertinent family history. Social History   Socioeconomic History   Marital status: Single    Spouse name: Not on file   Number of children: Not on file   Years of education: Not on file   Highest education level: Not on file  Occupational History   Not on file  Tobacco Use   Smoking status: Never   Smokeless tobacco: Never  Vaping Use   Vaping Use: Never used  Substance and Sexual Activity   Alcohol use: Not Currently    Comment: occ   Drug use: Not Currently   Sexual activity: Not on file  Other Topics Concern   Not on file  Social History Narrative   Not on file   Social Determinants of Health   Financial Resource Strain: Not on file  Food Insecurity: Not on file   Transportation Needs: Not on file  Physical Activity: Not on file  Stress: Not on file  Social Connections: Not on file     Review of Systems  Constitutional:  Negative for appetite change and unexpected weight change.  HENT:  Negative for congestion and sinus pressure.   Respiratory:  Negative for cough, chest tightness and shortness of breath.   Cardiovascular:  Negative for chest pain and palpitations.       Increased heart rate as outlined.   Gastrointestinal:  Negative for abdominal pain, diarrhea, nausea and vomiting.  Genitourinary:  Negative for difficulty urinating and dysuria.  Musculoskeletal:  Negative for joint swelling and myalgias.  Skin:  Negative for color change and rash.  Neurological:  Negative for dizziness and headaches.  Psychiatric/Behavioral:  Negative for agitation and dysphoric mood.        Increased anxiety and some OCD tendencies.         Objective:     BP 120/70   Pulse 71   Temp 98 F (36.7 C)   Resp 16   Ht 5\' 3"  (1.6 m)   Wt 185 lb (83.9 kg)   SpO2 98%   BMI 32.77 kg/m  Wt Readings from Last 3 Encounters:  03/26/23 185 lb (83.9 kg)  11/07/22 181 lb 3.2  oz (82.2 kg)  09/24/22 176 lb 6.4 oz (80 kg)    Physical Exam Vitals reviewed.  Constitutional:      General: She is not in acute distress.    Appearance: Normal appearance.  HENT:     Head: Normocephalic and atraumatic.     Right Ear: External ear normal.     Left Ear: External ear normal.  Eyes:     General: No scleral icterus.       Right eye: No discharge.        Left eye: No discharge.     Conjunctiva/sclera: Conjunctivae normal.  Neck:     Thyroid: No thyromegaly.  Cardiovascular:     Rate and Rhythm: Normal rate and regular rhythm.  Pulmonary:     Effort: No respiratory distress.     Breath sounds: Normal breath sounds. No wheezing.  Abdominal:     General: Bowel sounds are normal.     Palpations: Abdomen is soft.     Tenderness: There is no abdominal  tenderness.  Musculoskeletal:        General: No swelling or tenderness.     Cervical back: Neck supple. No tenderness.  Lymphadenopathy:     Cervical: No cervical adenopathy.  Skin:    Findings: No erythema or rash.  Neurological:     Mental Status: She is alert.  Psychiatric:        Mood and Affect: Mood normal.        Behavior: Behavior normal.      Outpatient Encounter Medications as of 03/26/2023  Medication Sig   hydrOXYzine (ATARAX) 25 MG tablet Take 25 mg by mouth 2 (two)  times daily as needed for Anxiety (Anxiety/panic).   KAITLIB FE 0.8-25 MG-MCG tablet SMARTSIG:1 Tablet(s) By Mouth Daily   nystatin cream (MYCOSTATIN) Apply 1 Application topically 2 (two) times daily.   sertraline (ZOLOFT) 100 MG tablet Take 1 tablet (100 mg total) by mouth daily.   spironolactone (ALDACTONE) 50 MG tablet Take 50 mg by mouth 2 (two) times daily.   valACYclovir (VALTREX) 1000 MG tablet SMARTSIG:1 Tablet(s) By Mouth Daily   [DISCONTINUED] sertraline (ZOLOFT) 50 MG tablet Take 1 tablet (50 mg total) by mouth daily.   No facility-administered encounter medications on file as of 03/26/2023.     Lab Results  Component Value Date   WBC 4.5 03/26/2023   HGB 14.2 03/26/2023   HCT 41.7 03/26/2023   PLT 315.0 03/26/2023   GLUCOSE 94 03/26/2023   CHOL 191 03/26/2023   TRIG 158.0 (H) 03/26/2023   HDL 66.30 03/26/2023   LDLCALC 94 03/26/2023   ALT 16 03/26/2023   AST 18 03/26/2023   NA 138 03/26/2023   K 4.2 03/26/2023   CL 105 03/26/2023   CREATININE 0.75 03/26/2023   BUN 15 03/26/2023   CO2 25 03/26/2023   TSH 1.12 03/26/2023    No results found.     Assessment & Plan:  Increased heart rate Assessment & Plan: No cardiac symptoms with increased activity or exertion.  Treat anxiety - increased zoloft to 100mg  q day.  Decrease caffeine consumption.  Avoid energy drinks.  Stay hydrated.  Check routine labs - including electrolytes and kidney function with recent addition of  spironolactone.  Follow.  Call with update.   Orders: -     CBC with Differential/Platelet -     Lipid panel -     Comprehensive metabolic panel -     TSH  Anxiety Assessment & Plan: Continue  zoloft.  Discussed adjusting dose.  Will increase to 100mg  q day.  Follow.  Get her back in soon to reassess.    Iron deficiency anemia, unspecified iron deficiency anemia type Assessment & Plan: History of IDA.  Follow cbc and iron studies.  On OCPs.  Sees Dr Lanny Cramp [name]).    Ocular migraine Assessment & Plan: Has a history of occular migraines. No mention of problems today.    Obsessive-compulsive disorder, unspecified type Assessment & Plan: Increased zoloft to 100mg  q day as outlined.  Follow.     Genetic testing Assessment & Plan: Recent genetic testing.  Has f/u with Dr Donne Hazel for further recommendations and testing/screening.    PCOS (polycystic ovarian syndrome) Assessment & Plan: Being followed by gyn.  On spironolactone.  Follow.  Check labs as outlined.     Other orders -     Sertraline HCl; Take 1 tablet (100 mg total) by mouth daily.  Dispense: 30 tablet; Refill: 3     Einar Pheasant, MD

## 2023-03-27 ENCOUNTER — Encounter: Payer: Self-pay | Admitting: Internal Medicine

## 2023-03-27 DIAGNOSIS — Z1379 Encounter for other screening for genetic and chromosomal anomalies: Secondary | ICD-10-CM | POA: Insufficient documentation

## 2023-03-27 DIAGNOSIS — E282 Polycystic ovarian syndrome: Secondary | ICD-10-CM | POA: Insufficient documentation

## 2023-03-27 DIAGNOSIS — Z0000 Encounter for general adult medical examination without abnormal findings: Secondary | ICD-10-CM | POA: Insufficient documentation

## 2023-03-27 NOTE — Assessment & Plan Note (Signed)
No cardiac symptoms with increased activity or exertion.  Treat anxiety - increased zoloft to 100mg  q day.  Decrease caffeine consumption.  Avoid energy drinks.  Stay hydrated.  Check routine labs - including electrolytes and kidney function with recent addition of spironolactone.  Follow.  Call with update.

## 2023-03-27 NOTE — Assessment & Plan Note (Addendum)
Has a history of occular migraines. No mention of problems today.

## 2023-03-27 NOTE — Assessment & Plan Note (Signed)
Being followed by gyn.  On spironolactone.  Follow.  Check labs as outlined.

## 2023-03-27 NOTE — Assessment & Plan Note (Signed)
Recent genetic testing.  Has f/u with Dr Donne Hazel for further recommendations and testing/screening.

## 2023-03-27 NOTE — Assessment & Plan Note (Signed)
Increased zoloft to 100mg  q day as outlined.  Follow.

## 2023-03-27 NOTE — Assessment & Plan Note (Signed)
History of IDA.  Follow cbc and iron studies.  On OCPs.  Sees Dr Lanny Cramp [name]).

## 2023-03-27 NOTE — Assessment & Plan Note (Signed)
Continue zoloft.  Discussed adjusting dose.  Will increase to 100mg  q day.  Follow.  Get her back in soon to reassess.

## 2023-04-22 DIAGNOSIS — Z803 Family history of malignant neoplasm of breast: Secondary | ICD-10-CM | POA: Diagnosis not present

## 2023-04-22 DIAGNOSIS — Z1589 Genetic susceptibility to other disease: Secondary | ICD-10-CM | POA: Diagnosis not present

## 2023-05-27 ENCOUNTER — Ambulatory Visit: Payer: BC Managed Care – PPO | Admitting: Internal Medicine

## 2023-06-17 ENCOUNTER — Ambulatory Visit: Payer: BC Managed Care – PPO | Admitting: Internal Medicine

## 2023-07-24 ENCOUNTER — Encounter: Payer: Self-pay | Admitting: Internal Medicine

## 2023-07-24 ENCOUNTER — Ambulatory Visit: Payer: BC Managed Care – PPO | Admitting: Internal Medicine

## 2023-07-24 DIAGNOSIS — D509 Iron deficiency anemia, unspecified: Secondary | ICD-10-CM

## 2023-07-24 DIAGNOSIS — Z85820 Personal history of malignant melanoma of skin: Secondary | ICD-10-CM | POA: Diagnosis not present

## 2023-07-24 DIAGNOSIS — Z1379 Encounter for other screening for genetic and chromosomal anomalies: Secondary | ICD-10-CM | POA: Diagnosis not present

## 2023-07-24 DIAGNOSIS — Z713 Dietary counseling and surveillance: Secondary | ICD-10-CM

## 2023-07-24 DIAGNOSIS — T148XXA Other injury of unspecified body region, initial encounter: Secondary | ICD-10-CM

## 2023-07-24 DIAGNOSIS — F419 Anxiety disorder, unspecified: Secondary | ICD-10-CM

## 2023-07-24 DIAGNOSIS — E282 Polycystic ovarian syndrome: Secondary | ICD-10-CM

## 2023-07-24 DIAGNOSIS — F429 Obsessive-compulsive disorder, unspecified: Secondary | ICD-10-CM

## 2023-07-24 NOTE — Progress Notes (Unsigned)
   Subjective:    Patient ID: Linda Chung, female    DOB: [birth year], 27 y.o.   MRN: [id number]  Patient here for  Chief Complaint  Patient presents with  . Medical Management of Chronic Issues    HPI    Past Medical History:  Diagnosis Date  . Anxiety   . B12 deficiency   . IDA (iron deficiency anemia)   . Melanoma (HCC)   . OCD (obsessive compulsive disorder)   . Ocular migraine    History reviewed. No pertinent surgical history. History reviewed. No pertinent family history. Social History   Socioeconomic History  . Marital status: Single    Spouse name: Not on file  . Number of children: Not on file  . Years of education: Not on file  . Highest education level: Not on file  Occupational History  . Not on file  Tobacco Use  . Smoking status: Never  . Smokeless tobacco: Never  Vaping Use  . Vaping status: Never Used  Substance and Sexual Activity  . Alcohol use: Not Currently    Comment: occ  . Drug use: Not Currently  . Sexual activity: Not on file  Other Topics Concern  . Not on file  Social History Narrative  . Not on file   Social Determinants of Health   Financial Resource Strain: Not on file  Food Insecurity: Not on file  Transportation Needs: Not on file  Physical Activity: Not on file  Stress: Not on file  Social Connections: Not on file     Review of Systems     Objective:     BP 118/72   Pulse 89   Temp 98 F (36.7 C)   Resp 16   Ht 5\' 3"  (1.6 m)   Wt 179 lb (81.2 kg)   SpO2 99%   BMI 31.71 kg/m  Wt Readings from Last 3 Encounters:  07/24/23 179 lb (81.2 kg)  03/26/23 185 lb (83.9 kg)  11/07/22 181 lb 3.2 oz (82.2 kg)    Physical Exam   Outpatient Encounter Medications as of 07/24/2023  Medication Sig  . hydrOXYzine (ATARAX) 25 MG tablet Take 25 mg by mouth 2 (two)  times daily as needed for Anxiety (Anxiety/panic).  Marland Kitchen KAITLIB FE 0.8-25 MG-MCG tablet SMARTSIG:1 Tablet(s) By Mouth Daily  . sertraline (ZOLOFT) 100  MG tablet Take 1 tablet (100 mg total) by mouth daily.  Marland Kitchen spironolactone (ALDACTONE) 50 MG tablet Take 50 mg by mouth 2 (two) times daily.  . valACYclovir (VALTREX) 1000 MG tablet SMARTSIG:1 Tablet(s) By Mouth Daily  . [DISCONTINUED] nystatin cream (MYCOSTATIN) Apply 1 Application topically 2 (two) times daily.   No facility-administered encounter medications on file as of 07/24/2023.     Lab Results  Component Value Date   WBC 4.5 03/26/2023   HGB 14.2 03/26/2023   HCT 41.7 03/26/2023   PLT 315.0 03/26/2023   GLUCOSE 94 03/26/2023   CHOL 191 03/26/2023   TRIG 158.0 (H) 03/26/2023   HDL 66.30 03/26/2023   LDLCALC 94 03/26/2023   ALT 16 03/26/2023   AST 18 03/26/2023   NA 138 03/26/2023   K 4.2 03/26/2023   CL 105 03/26/2023   CREATININE 0.75 03/26/2023   BUN 15 03/26/2023   CO2 25 03/26/2023   TSH 1.12 03/26/2023    No results found.     Assessment & Plan:  There are no diagnoses linked to this encounter.   Dale Arnett, MD

## 2023-07-26 ENCOUNTER — Encounter: Payer: Self-pay | Admitting: Internal Medicine

## 2023-07-26 DIAGNOSIS — Z713 Dietary counseling and surveillance: Secondary | ICD-10-CM | POA: Insufficient documentation

## 2023-07-26 DIAGNOSIS — T148XXA Other injury of unspecified body region, initial encounter: Secondary | ICD-10-CM | POA: Insufficient documentation

## 2023-07-26 NOTE — Assessment & Plan Note (Signed)
She also had questions regarding weight loss options.  Discussed diet and exercise.  Discussed [facility] weight management program.  Discussed my recommendation to avoid stimulants.  Discussed GLP 1 agonist - treatment and possible side effects.

## 2023-07-26 NOTE — Assessment & Plan Note (Signed)
Recently evaluated by Dr Dwain Sarna - she has CHEK2 mutation.  Father was just diagnosed with stage 4 colon cancer. Has a paternal grandmother with breast cancer (age 27) and mother with breast cancer (age 56).   Recommended to start MR screening at around age 75 - approximately 70 years younger than her mother. Discussed.  Also discussed earlier colon screening.

## 2023-07-26 NOTE — Assessment & Plan Note (Signed)
History of IDA.  Follow cbc and iron studies.  On OCPs.  Sees Dr Lanny Cramp [name]).

## 2023-07-26 NOTE — Assessment & Plan Note (Signed)
Followed by Dr Isenstein.  

## 2023-07-26 NOTE — Assessment & Plan Note (Signed)
Presumed tick bite.  Unsure.  Persistent rash as outlined.  Treated with doxycyline by dermatology.  Continue topical steroid cream.  Add antihistamine to see if helps with local reaction.  Follow.  Call with update.

## 2023-07-26 NOTE — Assessment & Plan Note (Signed)
Being followed by gyn.  On spironolactone.  Follow.

## 2023-07-26 NOTE — Assessment & Plan Note (Signed)
Continue zoloft 100mg  q day. Doing well on this dose.  Follow.  Notify me if feels needs any further intervention.

## 2023-07-26 NOTE — Assessment & Plan Note (Signed)
Continue zoloft 100mg  q day as outlined.  Feels better.  Follow.

## 2023-07-29 ENCOUNTER — Encounter: Payer: Self-pay | Admitting: Internal Medicine

## 2023-07-29 NOTE — Telephone Encounter (Signed)
Nothing new. Dermatology f/u appt has been scheduled. She will keep [place] updated.

## 2023-07-29 NOTE — Telephone Encounter (Signed)
Please call her.  Prior to me seeing her, her dermatologist had given her doxycycline.  She has used topical steroid cream and has taken an antihistamine.  Given no significant improvement, I would like to her to f/u with her dermatologist - to see what would be next best step.  Keep [place] posted. Confirm no other new symptoms.

## 2023-08-05 ENCOUNTER — Other Ambulatory Visit: Payer: Self-pay | Admitting: Internal Medicine

## 2023-08-05 DIAGNOSIS — L309 Dermatitis, unspecified: Secondary | ICD-10-CM | POA: Diagnosis not present

## 2023-08-05 DIAGNOSIS — B359 Dermatophytosis, unspecified: Secondary | ICD-10-CM | POA: Diagnosis not present

## 2023-08-07 ENCOUNTER — Encounter: Payer: Self-pay | Admitting: Internal Medicine

## 2023-08-07 ENCOUNTER — Telehealth: Payer: BC Managed Care – PPO | Admitting: Internal Medicine

## 2023-08-07 DIAGNOSIS — F419 Anxiety disorder, unspecified: Secondary | ICD-10-CM

## 2023-08-07 DIAGNOSIS — Z713 Dietary counseling and surveillance: Secondary | ICD-10-CM

## 2023-08-07 MED ORDER — WEGOVY 0.25 MG/0.5ML ~~LOC~~ SOAJ: 0.2500 mg | SUBCUTANEOUS | 0 refills | Status: DC

## 2023-08-07 MED ORDER — ZEPBOUND 2.5 MG/0.5ML ~~LOC~~ SOAJ: 2.5000 mg | SUBCUTANEOUS | 2 refills | Status: DC

## 2023-08-07 NOTE — Progress Notes (Unsigned)
   Subjective:    Patient ID: Linda Chung, female    DOB: 05-04-1996, 27 y.o.   MRN: [id number]  Patient here for  Chief Complaint  Patient presents with   Medical Management of Chronic Issues    Discuss weight management and medication options.     HPI    Past Medical History:  Diagnosis Date   Anxiety    B12 deficiency    IDA (iron deficiency anemia)    Melanoma (HCC)    OCD (obsessive compulsive disorder)    Ocular migraine    No past surgical history on file. No family history on file. Social History   Socioeconomic History   Marital status: Single    Spouse name: Not on file   Number of children: Not on file   Years of education: Not on file   Highest education level: Not on file  Occupational History   Not on file  Tobacco Use   Smoking status: Never   Smokeless tobacco: Never  Vaping Use   Vaping status: Never Used  Substance and Sexual Activity   Alcohol use: Not Currently    Comment: occ   Drug use: Not Currently   Sexual activity: Not on file  Other Topics Concern   Not on file  Social History Narrative   Not on file   Social Determinants of Health   Financial Resource Strain: Not on file  Food Insecurity: Not on file  Transportation Needs: Not on file  Physical Activity: Not on file  Stress: Not on file  Social Connections: Not on file     Review of Systems     Objective:     Ht 5\' 3"  (1.6 m)   Wt 179 lb (81.2 kg)   BMI 31.71 kg/m  Wt Readings from Last 3 Encounters:  08/07/23 179 lb (81.2 kg)  07/24/23 179 lb (81.2 kg)  03/26/23 185 lb (83.9 kg)    Physical Exam   Outpatient Encounter Medications as of 08/07/2023  Medication Sig   hydrOXYzine (ATARAX) 25 MG tablet Take 25 mg by mouth 2 (two)  times daily as needed for Anxiety (Anxiety/panic).   KAITLIB FE 0.8-25 MG-MCG tablet SMARTSIG:1 Tablet(s) By Mouth Daily   sertraline (ZOLOFT) 100 MG tablet TAKE ONE TABLET BY MOUTH ONE TIME DAILY   spironolactone (ALDACTONE) 50  MG tablet Take 50 mg by mouth 2 (two) times daily.   valACYclovir (VALTREX) 1000 MG tablet SMARTSIG:1 Tablet(s) By Mouth Daily   No facility-administered encounter medications on file as of 08/07/2023.     Lab Results  Component Value Date   WBC 4.5 03/26/2023   HGB 14.2 03/26/2023   HCT 41.7 03/26/2023   PLT 315.0 03/26/2023   GLUCOSE 94 03/26/2023   CHOL 191 03/26/2023   TRIG 158.0 (H) 03/26/2023   HDL 66.30 03/26/2023   LDLCALC 94 03/26/2023   ALT 16 03/26/2023   AST 18 03/26/2023   NA 138 03/26/2023   K 4.2 03/26/2023   CL 105 03/26/2023   CREATININE 0.75 03/26/2023   BUN 15 03/26/2023   CO2 25 03/26/2023   TSH 1.12 03/26/2023    No results found.     Assessment & Plan:  There are no diagnoses linked to this encounter.   Dale [place], MD

## 2023-08-07 NOTE — Telephone Encounter (Signed)
Patient has been scheduled

## 2023-08-07 NOTE — Telephone Encounter (Signed)
Ok to send in [place].

## 2023-08-07 NOTE — Telephone Encounter (Signed)
Thank her for the update.  Can schedule an appt to discuss GLP1 if desires.  For weight loss - wegovy or zepbound would be the options.

## 2023-08-09 ENCOUNTER — Encounter: Payer: Self-pay | Admitting: Internal Medicine

## 2023-08-09 NOTE — Assessment & Plan Note (Signed)
Discussed GLP 1.  Discussed possible side effects and contraindications.  Discussed insurance issues.  Agreeable - trial of GLP 1 agonist.  Will contact med management - pharmacy for assistance.

## 2023-08-09 NOTE — Progress Notes (Signed)
Patient ID: Linda Chung, female   DOB: Jul 15, 1996, 27 y.o.   MRN: [id number]   Virtual Visit via video Note  I connected with Linda Chung by a video enabled telemedicine application and verified that I am speaking with the correct person using two identifiers. Location patient: home Location provider: work  Persons participating in the virtual visit: patient, provider  The limitations, risks, security and privacy concerns of performing an evaluation and management service by video and the availability of in person appointments have been discussed.  It has also been discussed with the patient that there may be a patient responsible charge related to this service. The patient expressed understanding and agreed to proceed.   Reason for visit: work in appt.   HPI: Work in to discuss GLP 1 agonist. Have discussed current weight loss medications, diet and exercise.  Discussed GLP 1 agonist.  Discussed possible side effects and how the medication works.  Discussed availability and insurance coverage.  Agreeable to referral to med management if able.  Physically doing ok.  No problems swallowing.  No bowel issues.     ROS: See pertinent positives and negatives per HPI.  Past Medical History:  Diagnosis Date   Anxiety    B12 deficiency    IDA (iron deficiency anemia)    Melanoma (HCC)    OCD (obsessive compulsive disorder)    Ocular migraine     History reviewed. No pertinent surgical history.  History reviewed. No pertinent family history.  SOCIAL HX: reviewed.    Current Outpatient Medications:    hydrOXYzine (ATARAX) 25 MG tablet, Take 25 mg by mouth 2 (two)  times daily as needed for Anxiety (Anxiety/panic)., Disp: , Rfl:    KAITLIB FE 0.8-25 MG-MCG tablet, SMARTSIG:1 Tablet(s) By Mouth Daily, Disp: , Rfl:    sertraline (ZOLOFT) 100 MG tablet, TAKE ONE TABLET BY MOUTH ONE TIME DAILY, Disp: 30 tablet, Rfl: 3   spironolactone (ALDACTONE) 50 MG tablet, Take 50 mg by mouth 2  (two) times daily., Disp: , Rfl:    valACYclovir (VALTREX) 1000 MG tablet, SMARTSIG:1 Tablet(s) By Mouth Daily, Disp: , Rfl:    Semaglutide-Weight Management (WEGOVY) 0.25 MG/0.5ML SOAJ, Inject 0.25 mg into the skin once a week., Disp: 2 mL, Rfl: 0  EXAM:  GENERAL: alert, oriented, appears well and in no acute distress  HEENT: atraumatic, conjunttiva clear, no obvious abnormalities on inspection of external nose and ears  NECK: normal movements of the head and neck  LUNGS: on inspection no signs of respiratory distress, breathing rate appears normal, no obvious gross SOB, gasping or wheezing  CV: no obvious cyanosis  PSYCH/NEURO: pleasant and cooperative, no obvious depression or anxiety, speech and thought processing grossly intact  ASSESSMENT AND PLAN:  Discussed the following assessment and plan:  Problem List Items Addressed This Visit     Weight loss counseling, encounter for - Primary    Discussed GLP 1.  Discussed possible side effects and contraindications.  Discussed insurance issues.  Agreeable - trial of GLP 1 agonist.  Will contact med management - pharmacy for assistance.        Anxiety    Continue zoloft 100mg  q day. Doing well on this dose.  Follow.         Return if symptoms worsen or fail to improve.   I discussed the assessment and treatment plan with the patient. The patient was provided an opportunity to ask questions and all were answered. The patient agreed with the plan and  demonstrated an understanding of the instructions.   The patient was advised to call back or seek an in-person evaluation if the symptoms worsen or if the condition fails to improve as anticipated.    Dale [place], MD

## 2023-08-09 NOTE — Assessment & Plan Note (Signed)
Continue zoloft 100mg  q day. Doing well on this dose.  Follow.

## 2023-08-20 ENCOUNTER — Encounter: Payer: Self-pay | Admitting: *Deleted

## 2023-08-20 MED ORDER — SERTRALINE HCL 100 MG PO TABS: 100.0000 mg | ORAL_TABLET | Freq: Every day | ORAL | 1 refills | Status: DC

## 2023-08-28 DIAGNOSIS — F4322 Adjustment disorder with anxiety: Secondary | ICD-10-CM | POA: Diagnosis not present

## 2023-09-02 DIAGNOSIS — D2262 Melanocytic nevi of left upper limb, including shoulder: Secondary | ICD-10-CM | POA: Diagnosis not present

## 2023-09-02 DIAGNOSIS — Z85820 Personal history of malignant melanoma of skin: Secondary | ICD-10-CM | POA: Diagnosis not present

## 2023-09-02 DIAGNOSIS — D225 Melanocytic nevi of trunk: Secondary | ICD-10-CM | POA: Diagnosis not present

## 2023-09-02 DIAGNOSIS — Z872 Personal history of diseases of the skin and subcutaneous tissue: Secondary | ICD-10-CM | POA: Diagnosis not present

## 2023-09-04 DIAGNOSIS — F4322 Adjustment disorder with anxiety: Secondary | ICD-10-CM | POA: Diagnosis not present

## 2023-09-18 DIAGNOSIS — F4322 Adjustment disorder with anxiety: Secondary | ICD-10-CM | POA: Diagnosis not present

## 2023-09-25 DIAGNOSIS — F4322 Adjustment disorder with anxiety: Secondary | ICD-10-CM | POA: Diagnosis not present

## 2023-10-09 DIAGNOSIS — F4322 Adjustment disorder with anxiety: Secondary | ICD-10-CM | POA: Diagnosis not present

## 2023-10-26 ENCOUNTER — Encounter: Payer: BC Managed Care – PPO | Admitting: Internal Medicine

## 2023-10-26 ENCOUNTER — Ambulatory Visit (INDEPENDENT_AMBULATORY_CARE_PROVIDER_SITE_OTHER): Payer: BC Managed Care – PPO | Admitting: Internal Medicine

## 2023-10-26 VITALS — BP 112/68 | HR 77 | Temp 98.0°F | Resp 17 | Ht 63.0 in | Wt 184.0 lb

## 2023-10-26 DIAGNOSIS — F429 Obsessive-compulsive disorder, unspecified: Secondary | ICD-10-CM | POA: Diagnosis not present

## 2023-10-26 DIAGNOSIS — Z13220 Encounter for screening for lipoid disorders: Secondary | ICD-10-CM | POA: Diagnosis not present

## 2023-10-26 DIAGNOSIS — E282 Polycystic ovarian syndrome: Secondary | ICD-10-CM

## 2023-10-26 DIAGNOSIS — Z0000 Encounter for general adult medical examination without abnormal findings: Secondary | ICD-10-CM | POA: Diagnosis not present

## 2023-10-26 DIAGNOSIS — D509 Iron deficiency anemia, unspecified: Secondary | ICD-10-CM

## 2023-10-26 DIAGNOSIS — Z713 Dietary counseling and surveillance: Secondary | ICD-10-CM

## 2023-10-26 DIAGNOSIS — F419 Anxiety disorder, unspecified: Secondary | ICD-10-CM

## 2023-10-26 NOTE — Progress Notes (Signed)
Subjective:    Patient ID: Leticia Penna, female    DOB: [birth year], 27 y.o.   MRN: [id number]  Patient here for  Chief Complaint  Patient presents with   Annual Exam    HPI Scheduled for a physical exam.  GYN performs her pelvic exams/pap smears.  She reports she is doing relatively well.  Work is going relatively well.  Handling stress.on zoloft and feels this is helping.  Is exercising regularly.  Breathing stable. Discussed GLP 1 agonist and possible side effects.  Her insurance does not cover. She is planning to f/u in [place] with a physician group for further weight management.  She does watch what she eats.       Past Medical History:  Diagnosis Date   Anxiety    B12 deficiency    IDA (iron deficiency anemia)    Melanoma (HCC)    OCD (obsessive compulsive disorder)    Ocular migraine    History reviewed. No pertinent surgical history. History reviewed. No pertinent family history. Social History   Socioeconomic History   Marital status: Single    Spouse name: Not on file   Number of children: Not on file   Years of education: Not on file   Highest education level: Master's degree (e.g., MA, MS, MEng, MEd, MSW, MBA)  Occupational History   Not on file  Tobacco Use   Smoking status: Never   Smokeless tobacco: Never  Vaping Use   Vaping status: Never Used  Substance and Sexual Activity   Alcohol use: Not Currently    Comment: occ   Drug use: Not Currently   Sexual activity: Not on file  Other Topics Concern   Not on file  Social History Narrative   Not on file   Social Determinants of Health   Financial Resource Strain: Low Risk  (10/26/2023)   Overall Financial Resource Strain (CARDIA)    Difficulty of Paying Living Expenses: Not hard at all  Food Insecurity: No Food Insecurity (10/26/2023)   Hunger Vital Sign    Worried About Running Out of Food in the Last Year: Never true    Ran Out of Food in the Last Year: Never true  Transportation Needs: No  Transportation Needs (10/26/2023)   PRAPARE - Administrator, Civil Service (Medical): No    Lack of Transportation (Non-Medical): No  Physical Activity: Sufficiently Active (10/26/2023)   Exercise Vital Sign    Days of Exercise per Week: 7 days    Minutes of Exercise per Session: 150+ min  Stress: No Stress Concern Present (10/26/2023)   [employer] of Occupational Health - Occupational Stress Questionnaire    Feeling of Stress : Not at all  Social Connections: Moderately Integrated (10/26/2023)   Social Connection and Isolation Panel [NHANES]    Frequency of Communication with Friends and Family: More than three times a week    Frequency of Social Gatherings with Friends and Family: Once a week    Attends Religious Services: Never    Database administrator or Organizations: Yes    Attends Banker Meetings: 1 to 4 times per year    Marital Status: Living with partner     Review of Systems  Constitutional:  Negative for appetite change and unexpected weight change.  HENT:  Negative for congestion, sinus pressure and sore throat.   Eyes:  Negative for pain and visual disturbance.  Respiratory:  Negative for cough, chest tightness and shortness of breath.  Cardiovascular:  Negative for chest pain and palpitations.  Gastrointestinal:  Negative for abdominal pain, diarrhea, nausea and vomiting.  Genitourinary:  Negative for difficulty urinating and dysuria.  Musculoskeletal:  Negative for joint swelling and myalgias.  Skin:  Negative for color change and rash.  Neurological:  Negative for dizziness and headaches.  Hematological:  Negative for adenopathy. Does not bruise/bleed easily.  Psychiatric/Behavioral:  Negative for agitation and dysphoric mood.        Objective:     BP 112/68   Pulse 77   Temp 98 F (36.7 C) (Oral)   Resp 17   Ht 5\' 3"  (1.6 m)   Wt 184 lb (83.5 kg)   SpO2 96%   BMI 32.59 kg/m  Wt Readings from Last 3 Encounters:   10/26/23 184 lb (83.5 kg)  08/07/23 179 lb (81.2 kg)  07/24/23 179 lb (81.2 kg)    Physical Exam Vitals reviewed.  Constitutional:      General: She is not in acute distress.    Appearance: Normal appearance.  HENT:     Head: Normocephalic and atraumatic.     Right Ear: External ear normal.     Left Ear: External ear normal.  Eyes:     General: No scleral icterus.       Right eye: No discharge.        Left eye: No discharge.     Conjunctiva/sclera: Conjunctivae normal.  Neck:     Thyroid: No thyromegaly.  Cardiovascular:     Rate and Rhythm: Normal rate and regular rhythm.  Pulmonary:     Effort: No respiratory distress.     Breath sounds: Normal breath sounds. No wheezing.  Abdominal:     General: Bowel sounds are normal.     Palpations: Abdomen is soft.     Tenderness: There is no abdominal tenderness.  Musculoskeletal:        General: No swelling or tenderness.     Cervical back: Neck supple. No tenderness.  Lymphadenopathy:     Cervical: No cervical adenopathy.  Skin:    Findings: No erythema or rash.  Neurological:     Mental Status: She is alert.  Psychiatric:        Mood and Affect: Mood normal.        Behavior: Behavior normal.      Outpatient Encounter Medications as of 10/26/2023  Medication Sig   hydrOXYzine (ATARAX) 25 MG tablet Take 25 mg by mouth 2 (two)  times daily as needed for Anxiety (Anxiety/panic).   KAITLIB FE 0.8-25 MG-MCG tablet SMARTSIG:1 Tablet(s) By Mouth Daily   sertraline (ZOLOFT) 100 MG tablet Take 1 tablet (100 mg total) by mouth daily.   spironolactone (ALDACTONE) 50 MG tablet Take 50 mg by mouth 2 (two) times daily.   valACYclovir (VALTREX) 1000 MG tablet SMARTSIG:1 Tablet(s) By Mouth Daily   [DISCONTINUED] Semaglutide-Weight Management (WEGOVY) 0.25 MG/0.5ML SOAJ Inject 0.25 mg into the skin once a week. (Patient not taking: Reported on 10/26/2023)   No facility-administered encounter medications on file as of 10/26/2023.      Lab Results  Component Value Date   WBC 4.5 03/26/2023   HGB 14.2 03/26/2023   HCT 41.7 03/26/2023   PLT 315.0 03/26/2023   GLUCOSE 91 10/26/2023   CHOL 226 (H) 10/26/2023   TRIG 269.0 (H) 10/26/2023   HDL 68.10 10/26/2023   LDLCALC 104 (H) 10/26/2023   ALT 15 10/26/2023   AST 19 10/26/2023   NA 136 10/26/2023  K 4.2 10/26/2023   CL 103 10/26/2023   CREATININE 0.77 10/26/2023   BUN 17 10/26/2023   CO2 26 10/26/2023   TSH 1.12 03/26/2023       Assessment & Plan:  Routine general medical examination at a health care facility  Screening cholesterol level -     Lipid panel  PCOS (polycystic ovarian syndrome) Assessment & Plan: Being followed by gyn.  On spironolactone.  Follow.    Orders: -     Comprehensive metabolic panel  Obsessive-compulsive disorder, unspecified type Assessment & Plan: Continue zoloft 100mg  q day as outlined.  Doing well on the medication. Feels better.  Follow.    Weight loss counseling, encounter for Assessment & Plan: Discussed GLP 1.  Discussed possible side effects and contraindications.  Discussed insurance issues.  She is planning to f/u with a physician weight management clinic in [place].      Iron deficiency anemia, unspecified iron deficiency anemia type Assessment & Plan: History of IDA.  Follow cbc and iron studies.  On OCPs.  Sees Dr Conni Elliot [place]).    Anxiety Assessment & Plan: Continue zoloft 100mg  q day. Doing well on this dose.  Follow.     Health care maintenance Assessment & Plan: Physical scheduled for today 10/26/23.  Gets breast, pelvic/pap smears through gyn.  Check cholesterol.       Dale Bardolph, MD

## 2023-10-27 ENCOUNTER — Telehealth: Payer: Self-pay | Admitting: Internal Medicine

## 2023-10-27 LAB — LIPID PANEL
Cholesterol: 226 mg/dL — ABNORMAL HIGH (ref 0–200)
HDL: 68.1 mg/dL (ref >39.00)
LDL Cholesterol: 104 mg/dL — ABNORMAL HIGH (ref 0–99)
NonHDL: 158.12
Total CHOL/HDL Ratio: 3
Triglycerides: 269 mg/dL — ABNORMAL HIGH (ref 0.0–149.0)
VLDL: 53.8 mg/dL — ABNORMAL HIGH (ref 0.0–40.0)

## 2023-10-27 LAB — COMPREHENSIVE METABOLIC PANEL
ALT: 15 U/L (ref 0–35)
AST: 19 U/L (ref 0–37)
Albumin: 4.3 g/dL (ref 3.5–5.2)
Alkaline Phosphatase: 22 U/L — ABNORMAL LOW (ref 39–117)
BUN: 17 mg/dL (ref 6–23)
CO2: 26 meq/L (ref 19–32)
Calcium: 9.2 mg/dL (ref 8.4–10.5)
Chloride: 103 meq/L (ref 96–112)
Creatinine, Ser: 0.77 mg/dL (ref 0.40–1.20)
GFR: 105.97 mL/min (ref >60.00)
Glucose, Bld: 91 mg/dL (ref 70–99)
Potassium: 4.2 meq/L (ref 3.5–5.1)
Sodium: 136 meq/L (ref 135–145)
Total Bilirubin: 0.2 mg/dL (ref 0.2–1.2)
Total Protein: 6.9 g/dL (ref 6.0–8.3)

## 2023-10-27 NOTE — Telephone Encounter (Signed)
Patient called and said her lab results came back abnormal and she would like to discuss. Can someone call her. Her number is [phone].

## 2023-10-28 NOTE — Telephone Encounter (Signed)
See result note.  

## 2023-11-01 ENCOUNTER — Encounter: Payer: Self-pay | Admitting: Internal Medicine

## 2023-11-01 NOTE — Assessment & Plan Note (Signed)
Being followed by gyn.  On spironolactone.  Follow.

## 2023-11-01 NOTE — Assessment & Plan Note (Signed)
Physical scheduled for today 10/26/23.  Gets breast, pelvic/pap smears through gyn.  Check cholesterol.

## 2023-11-01 NOTE — Assessment & Plan Note (Signed)
Discussed GLP 1.  Discussed possible side effects and contraindications.  Discussed insurance issues.  She is planning to f/u with a physician weight management clinic in [place].

## 2023-11-01 NOTE — Assessment & Plan Note (Signed)
History of IDA.  Follow cbc and iron studies.  On OCPs.  Sees Dr Lanny Cramp [name]).

## 2023-11-01 NOTE — Assessment & Plan Note (Signed)
Continue zoloft 100mg  q day. Doing well on this dose.  Follow.

## 2023-11-01 NOTE — Assessment & Plan Note (Signed)
Continue zoloft 100mg  q day as outlined.  Doing well on the medication. Feels better.  Follow.

## 2023-11-06 DIAGNOSIS — F4322 Adjustment disorder with anxiety: Secondary | ICD-10-CM | POA: Diagnosis not present

## 2023-11-20 DIAGNOSIS — F4322 Adjustment disorder with anxiety: Secondary | ICD-10-CM | POA: Diagnosis not present

## 2023-12-04 DIAGNOSIS — F4322 Adjustment disorder with anxiety: Secondary | ICD-10-CM | POA: Diagnosis not present

## 2023-12-16 DIAGNOSIS — F4322 Adjustment disorder with anxiety: Secondary | ICD-10-CM | POA: Diagnosis not present

## 2024-01-01 DIAGNOSIS — F4322 Adjustment disorder with anxiety: Secondary | ICD-10-CM | POA: Diagnosis not present

## 2024-01-19 DIAGNOSIS — J069 Acute upper respiratory infection, unspecified: Secondary | ICD-10-CM | POA: Diagnosis not present

## 2024-01-19 DIAGNOSIS — R070 Pain in throat: Secondary | ICD-10-CM | POA: Diagnosis not present

## 2024-02-12 DIAGNOSIS — F4322 Adjustment disorder with anxiety: Secondary | ICD-10-CM | POA: Diagnosis not present

## 2024-02-15 ENCOUNTER — Other Ambulatory Visit: Payer: Self-pay | Admitting: Internal Medicine

## 2024-03-14 DIAGNOSIS — R6889 Other general symptoms and signs: Secondary | ICD-10-CM | POA: Diagnosis not present

## 2024-03-18 DIAGNOSIS — R6889 Other general symptoms and signs: Secondary | ICD-10-CM | POA: Diagnosis not present

## 2024-03-25 DIAGNOSIS — D2272 Melanocytic nevi of left lower limb, including hip: Secondary | ICD-10-CM | POA: Diagnosis not present

## 2024-03-25 DIAGNOSIS — D2261 Melanocytic nevi of right upper limb, including shoulder: Secondary | ICD-10-CM | POA: Diagnosis not present

## 2024-03-25 DIAGNOSIS — D2262 Melanocytic nevi of left upper limb, including shoulder: Secondary | ICD-10-CM | POA: Diagnosis not present

## 2024-03-25 DIAGNOSIS — D225 Melanocytic nevi of trunk: Secondary | ICD-10-CM | POA: Diagnosis not present

## 2024-03-27 DIAGNOSIS — G44209 Tension-type headache, unspecified, not intractable: Secondary | ICD-10-CM | POA: Diagnosis not present

## 2024-04-07 DIAGNOSIS — Z01419 Encounter for gynecological examination (general) (routine) without abnormal findings: Secondary | ICD-10-CM | POA: Diagnosis not present

## 2024-04-07 DIAGNOSIS — Z1331 Encounter for screening for depression: Secondary | ICD-10-CM | POA: Diagnosis not present

## 2024-04-11 LAB — HM PAP SMEAR

## 2024-04-25 ENCOUNTER — Ambulatory Visit: Payer: BC Managed Care – PPO | Admitting: Internal Medicine

## 2024-06-21 ENCOUNTER — Ambulatory Visit: Admitting: Internal Medicine

## 2024-06-21 ENCOUNTER — Encounter: Payer: Self-pay | Admitting: Internal Medicine

## 2024-06-21 VITALS — BP 110/68 | HR 89 | Temp 97.9°F | Resp 16 | Ht 63.0 in | Wt 142.6 lb

## 2024-06-21 DIAGNOSIS — F419 Anxiety disorder, unspecified: Secondary | ICD-10-CM

## 2024-06-21 DIAGNOSIS — D509 Iron deficiency anemia, unspecified: Secondary | ICD-10-CM | POA: Diagnosis not present

## 2024-06-21 DIAGNOSIS — E538 Deficiency of other specified B group vitamins: Secondary | ICD-10-CM | POA: Diagnosis not present

## 2024-06-21 DIAGNOSIS — E7800 Pure hypercholesterolemia, unspecified: Secondary | ICD-10-CM | POA: Diagnosis not present

## 2024-06-21 DIAGNOSIS — E282 Polycystic ovarian syndrome: Secondary | ICD-10-CM

## 2024-06-21 MED ORDER — SERTRALINE HCL 100 MG PO TABS: 100.0000 mg | ORAL_TABLET | Freq: Every day | ORAL | 1 refills | Status: DC

## 2024-06-21 NOTE — Assessment & Plan Note (Signed)
 Has been followed by gyn. On spironolactone. Check metabolic panel today.

## 2024-06-21 NOTE — Assessment & Plan Note (Signed)
 Check B12 today.

## 2024-06-21 NOTE — Assessment & Plan Note (Signed)
 History of IDA.  Follow cbc.  On OCPs.  Sees Dr Rendell Kaylyn).

## 2024-06-21 NOTE — Progress Notes (Signed)
 Subjective:    Patient ID: Linda Chung, female    DOB: 02-Jun-1996, 28 y.o.   MRN: [id number]  Patient here for  Chief Complaint  Patient presents with   Medical Management of Chronic Issues    HPI Here for a scheduled follow up - follow up regarding IDA and increased stress/anxiety. On zoloft . Doing well on this medication. Has bought a house. [place] in [place]. Has been  doing well on tirzepatide . Has lost weight. Stays active. Tolerating the medication. Bowels moving. Breathing stable.    Past Medical History:  Diagnosis Date   Anxiety    B12 deficiency    IDA (iron deficiency anemia)    Melanoma (HCC)    OCD (obsessive compulsive disorder)    Ocular migraine    History reviewed. No pertinent surgical history. History reviewed. No pertinent family history. Social History   Socioeconomic History   Marital status: Single    Spouse name: Not on file   Number of children: Not on file   Years of education: Not on file   Highest education level: Master's degree (e.g., MA, MS, MEng, MEd, MSW, MBA)  Occupational History   Not on file  Tobacco Use   Smoking status: Never   Smokeless tobacco: Never  Vaping Use   Vaping status: Never Used  Substance and Sexual Activity   Alcohol use: Not Currently    Comment: occ   Drug use: Not Currently   Sexual activity: Not on file  Other Topics Concern   Not on file  Social History Narrative   Not on file   Social Drivers of Health   Financial Resource Strain: Low Risk  (10/26/2023)   Overall Financial Resource Strain (CARDIA)    Difficulty of Paying Living Expenses: Not hard at all  Food Insecurity: No Food Insecurity (10/26/2023)   Hunger Vital Sign    Worried About Running Out of Food in the Last Year: Never true    Ran Out of Food in the Last Year: Never true  Transportation Needs: No Transportation Needs (10/26/2023)   PRAPARE - Administrator, Civil Service (Medical): No    Lack of Transportation  (Non-Medical): No  Physical Activity: Sufficiently Active (10/26/2023)   Exercise Vital Sign    Days of Exercise per Week: 7 days    Minutes of Exercise per Session: 150+ min  Stress: No Stress Concern Present (10/26/2023)   [employer] of Occupational Health - Occupational Stress Questionnaire    Feeling of Stress : Not at all  Social Connections: Moderately Integrated (10/26/2023)   Social Connection and Isolation Panel    Frequency of Communication with Friends and Family: More than three times a week    Frequency of Social Gatherings with Friends and Family: Once a week    Attends Religious Services: Never    Database administrator or Organizations: Yes    Attends Banker Meetings: 1 to 4 times per year    Marital Status: Living with partner     Review of Systems  Constitutional:  Negative for unexpected weight change.       Feels better.   HENT:  Negative for congestion and sinus pressure.   Respiratory:  Negative for cough, chest tightness and shortness of breath.   Cardiovascular:  Negative for chest pain, palpitations and leg swelling.  Gastrointestinal:  Negative for abdominal pain, diarrhea, nausea and vomiting.  Genitourinary:  Negative for difficulty urinating and dysuria.  Musculoskeletal:  Negative for  joint swelling and myalgias.  Skin:  Negative for color change and rash.  Neurological:  Negative for dizziness and headaches.  Psychiatric/Behavioral:  Negative for agitation and dysphoric mood.        Objective:     BP 110/68   Pulse 89   Temp 97.9 F (36.6 C)   Resp 16   Ht 5' 3 (1.6 m)   Wt 142 lb 9.6 oz (64.7 kg)   SpO2 98%   BMI 25.26 kg/m  Wt Readings from Last 3 Encounters:  06/21/24 142 lb 9.6 oz (64.7 kg)  10/26/23 184 lb (83.5 kg)  08/07/23 179 lb (81.2 kg)    Physical Exam Vitals reviewed.  Constitutional:      General: She is not in acute distress.    Appearance: Normal appearance.  HENT:     Head: Normocephalic  and atraumatic.     Right Ear: External ear normal.     Left Ear: External ear normal.     Mouth/Throat:     Pharynx: No oropharyngeal exudate or posterior oropharyngeal erythema.   Eyes:     General: No scleral icterus.       Right eye: No discharge.        Left eye: No discharge.     Conjunctiva/sclera: Conjunctivae normal.   Neck:     Thyroid: No thyromegaly.   Cardiovascular:     Rate and Rhythm: Normal rate and regular rhythm.  Pulmonary:     Effort: No respiratory distress.     Breath sounds: Normal breath sounds. No wheezing.  Abdominal:     General: Bowel sounds are normal.     Palpations: Abdomen is soft.     Tenderness: There is no abdominal tenderness.   Musculoskeletal:        General: No swelling or tenderness.     Cervical back: Neck supple. No tenderness.  Lymphadenopathy:     Cervical: No cervical adenopathy.   Skin:    Findings: No erythema or rash.   Neurological:     Mental Status: She is alert.   Psychiatric:        Mood and Affect: Mood normal.        Behavior: Behavior normal.         Outpatient Encounter Medications as of 06/21/2024  Medication Sig   tirzepatide  (MOUNJARO) 7.5 MG/0.5ML Pen Inject 7.5 mg into the skin once a week.   hydrOXYzine (ATARAX) 25 MG tablet Take 25 mg by mouth 2 (two)  times daily as needed for Anxiety (Anxiety/panic).   KAITLIB FE 0.8-25 MG-MCG tablet SMARTSIG:1 Tablet(s) By Mouth Daily   sertraline  (ZOLOFT ) 100 MG tablet Take 1 tablet (100 mg total) by mouth daily.   spironolactone (ALDACTONE) 50 MG tablet Take 50 mg by mouth 2 (two) times daily.   valACYclovir (VALTREX) 1000 MG tablet SMARTSIG:1 Tablet(s) By Mouth Daily   [DISCONTINUED] sertraline  (ZOLOFT ) 100 MG tablet TAKE 1 TABLET DAILY   No facility-administered encounter medications on file as of 06/21/2024.     Lab Results  Component Value Date   WBC 4.5 03/26/2023   HGB 14.2 03/26/2023   HCT 41.7 03/26/2023   PLT 315.0 03/26/2023   GLUCOSE 91  10/26/2023   CHOL 226 (H) 10/26/2023   TRIG 269.0 (H) 10/26/2023   HDL 68.10 10/26/2023   LDLCALC 104 (H) 10/26/2023   ALT 15 10/26/2023   AST 19 10/26/2023   NA 136 10/26/2023   K 4.2 10/26/2023   CL 103 10/26/2023  CREATININE 0.77 10/26/2023   BUN 17 10/26/2023   CO2 26 10/26/2023   TSH 1.12 03/26/2023       Assessment & Plan:  Hypercholesterolemia Assessment & Plan: Triglycerides elevated last check.  Has lost weight. Recheck lipid panel today.   Orders: -     CBC with Differential/Platelet -     Comprehensive metabolic panel with GFR -     Lipid panel  Anxiety Assessment & Plan: Doing well on zoloft  100mg  q day. No changes today. Follow.    B12 deficiency Assessment & Plan: Check B12 today.   Orders: -     Vitamin B12  Iron deficiency anemia, unspecified iron deficiency anemia type Assessment & Plan: History of IDA.  Follow cbc.  On OCPs.  Sees Dr Rendell Kaylyn).    PCOS (polycystic ovarian syndrome) Assessment & Plan: Has been followed by gyn. On spironolactone. Check metabolic panel today.    Other orders -     Sertraline  HCl; Take 1 tablet (100 mg total) by mouth daily.  Dispense: 90 tablet; Refill: 1     Allena Hamilton, MD

## 2024-06-21 NOTE — Assessment & Plan Note (Signed)
 Doing well on zoloft  100mg  q day. No changes today. Follow.

## 2024-06-21 NOTE — Assessment & Plan Note (Signed)
 Triglycerides elevated last check.  Has lost weight. Recheck lipid panel today.

## 2024-06-22 LAB — CBC WITH DIFFERENTIAL/PLATELET
Basophils Absolute: 0.1 K/uL (ref 0.0–0.1)
Basophils Relative: 0.9 % (ref 0.0–3.0)
Eosinophils Absolute: 0 K/uL (ref 0.0–0.7)
Eosinophils Relative: 0.6 % (ref 0.0–5.0)
HCT: 42.4 % (ref 36.0–46.0)
Hemoglobin: 14.6 g/dL (ref 12.0–15.0)
Lymphocytes Relative: 53.3 % — ABNORMAL HIGH (ref 12.0–46.0)
Lymphs Abs: 3.5 K/uL (ref 0.7–4.0)
MCHC: 34.4 g/dL (ref 30.0–36.0)
MCV: 89.7 fl (ref 78.0–100.0)
Monocytes Absolute: 0.5 K/uL (ref 0.1–1.0)
Monocytes Relative: 8.4 % (ref 3.0–12.0)
Neutro Abs: 2.4 K/uL (ref 1.4–7.7)
Neutrophils Relative %: 36.8 % — ABNORMAL LOW (ref 43.0–77.0)
Platelets: 327 K/uL (ref 150.0–400.0)
RBC: 4.73 Mil/uL (ref 3.87–5.11)
RDW: 12.5 % (ref 11.5–15.5)
WBC: 6.6 K/uL (ref 4.0–10.5)

## 2024-06-22 LAB — COMPREHENSIVE METABOLIC PANEL WITH GFR
ALT: 15 U/L (ref 0–35)
AST: 20 U/L (ref 0–37)
Albumin: 4.6 g/dL (ref 3.5–5.2)
Alkaline Phosphatase: 20 U/L — ABNORMAL LOW (ref 39–117)
BUN: 16 mg/dL (ref 6–23)
CO2: 27 meq/L (ref 19–32)
Calcium: 10.1 mg/dL (ref 8.4–10.5)
Chloride: 102 meq/L (ref 96–112)
Creatinine, Ser: 0.82 mg/dL (ref 0.40–1.20)
GFR: 97.81 mL/min (ref >60.00)
Glucose, Bld: 76 mg/dL (ref 70–99)
Potassium: 4.2 meq/L (ref 3.5–5.1)
Sodium: 139 meq/L (ref 135–145)
Total Bilirubin: 0.3 mg/dL (ref 0.2–1.2)
Total Protein: 7.3 g/dL (ref 6.0–8.3)

## 2024-06-22 LAB — LIPID PANEL
Cholesterol: 196 mg/dL (ref 0–200)
HDL: 69.5 mg/dL
LDL Cholesterol: 101 mg/dL — ABNORMAL HIGH (ref 0–99)
NonHDL: 126.86
Total CHOL/HDL Ratio: 3
Triglycerides: 130 mg/dL (ref 0.0–149.0)
VLDL: 26 mg/dL (ref 0.0–40.0)

## 2024-06-22 LAB — VITAMIN B12: Vitamin B-12: >1500 pg/mL — ABNORMAL HIGH (ref 211–911)

## 2024-06-23 ENCOUNTER — Ambulatory Visit: Payer: Self-pay | Admitting: Internal Medicine

## 2024-08-02 ENCOUNTER — Encounter: Payer: Self-pay | Admitting: Internal Medicine

## 2024-08-03 NOTE — Telephone Encounter (Signed)
 See me before calling. Given that she does not have a diagnosis of diabetes, the two medications we can prescribe for weight loss are zepbound  and wegovy . Zepbound  is the same medication as mounjaro, but I am not sure that her insurance would cover. We would have to submit medication and see if covered.

## 2024-09-28 DIAGNOSIS — B349 Viral infection, unspecified: Secondary | ICD-10-CM | POA: Diagnosis not present

## 2024-10-27 ENCOUNTER — Encounter: Admitting: Internal Medicine

## 2024-11-28 DIAGNOSIS — R059 Cough, unspecified: Secondary | ICD-10-CM | POA: Diagnosis not present

## 2024-11-28 DIAGNOSIS — J069 Acute upper respiratory infection, unspecified: Secondary | ICD-10-CM | POA: Diagnosis not present

## 2025-01-20 ENCOUNTER — Other Ambulatory Visit: Payer: Self-pay | Admitting: Internal Medicine

## 2025-01-20 NOTE — Telephone Encounter (Signed)
 Refilled x 1. Needs to keep f/u appt on 01/24/25

## 2025-01-24 ENCOUNTER — Encounter: Payer: Self-pay | Admitting: Internal Medicine

## 2025-01-24 ENCOUNTER — Ambulatory Visit: Payer: Self-pay | Admitting: Internal Medicine

## 2025-01-24 ENCOUNTER — Ambulatory Visit: Admitting: Internal Medicine

## 2025-01-24 VITALS — BP 100/70 | HR 82 | Temp 98.2°F | Ht 62.0 in | Wt 133.0 lb

## 2025-01-24 DIAGNOSIS — Z0000 Encounter for general adult medical examination without abnormal findings: Secondary | ICD-10-CM | POA: Diagnosis not present

## 2025-01-24 DIAGNOSIS — F419 Anxiety disorder, unspecified: Secondary | ICD-10-CM | POA: Diagnosis not present

## 2025-01-24 DIAGNOSIS — E282 Polycystic ovarian syndrome: Secondary | ICD-10-CM | POA: Diagnosis not present

## 2025-01-24 DIAGNOSIS — Z1379 Encounter for other screening for genetic and chromosomal anomalies: Secondary | ICD-10-CM | POA: Diagnosis not present

## 2025-01-24 DIAGNOSIS — F429 Obsessive-compulsive disorder, unspecified: Secondary | ICD-10-CM | POA: Diagnosis not present

## 2025-01-24 DIAGNOSIS — Z85820 Personal history of malignant melanoma of skin: Secondary | ICD-10-CM

## 2025-01-24 DIAGNOSIS — D509 Iron deficiency anemia, unspecified: Secondary | ICD-10-CM | POA: Diagnosis not present

## 2025-01-24 DIAGNOSIS — E7800 Pure hypercholesterolemia, unspecified: Secondary | ICD-10-CM | POA: Diagnosis not present

## 2025-01-24 DIAGNOSIS — Z713 Dietary counseling and surveillance: Secondary | ICD-10-CM | POA: Diagnosis not present

## 2025-01-24 LAB — LIPID PANEL
Cholesterol: 175 mg/dL (ref 28–200)
HDL: 57.2 mg/dL
LDL Cholesterol: 74 mg/dL (ref 10–99)
NonHDL: 118.27
Total CHOL/HDL Ratio: 3
Triglycerides: 220 mg/dL — ABNORMAL HIGH (ref 10.0–149.0)
VLDL: 44 mg/dL — ABNORMAL HIGH (ref 0.0–40.0)

## 2025-01-24 LAB — CBC WITH DIFFERENTIAL/PLATELET
Basophils Absolute: 0.1 10*3/uL (ref 0.0–0.1)
Basophils Relative: 1 % (ref 0.0–3.0)
Eosinophils Absolute: 0 10*3/uL (ref 0.0–0.7)
Eosinophils Relative: 0.6 % (ref 0.0–5.0)
HCT: 41.1 % (ref 36.0–46.0)
Hemoglobin: 14.2 g/dL (ref 12.0–15.0)
Lymphocytes Relative: 48.7 % — ABNORMAL HIGH (ref 12.0–46.0)
Lymphs Abs: 3.3 10*3/uL (ref 0.7–4.0)
MCHC: 34.6 g/dL (ref 30.0–36.0)
MCV: 91.7 fl (ref 78.0–100.0)
Monocytes Absolute: 0.4 10*3/uL (ref 0.1–1.0)
Monocytes Relative: 5.7 % (ref 3.0–12.0)
Neutro Abs: 2.9 10*3/uL (ref 1.4–7.7)
Neutrophils Relative %: 44 % (ref 43.0–77.0)
Platelets: 289 10*3/uL (ref 150.0–400.0)
RBC: 4.48 Mil/uL (ref 3.87–5.11)
RDW: 12.6 % (ref 11.5–15.5)
WBC: 6.7 10*3/uL (ref 4.0–10.5)

## 2025-01-24 LAB — COMPREHENSIVE METABOLIC PANEL WITH GFR
ALT: 13 U/L (ref 3–35)
AST: 18 U/L (ref 5–37)
Albumin: 4.7 g/dL (ref 3.5–5.2)
Alkaline Phosphatase: 19 U/L — ABNORMAL LOW (ref 39–117)
BUN: 19 mg/dL (ref 6–23)
CO2: 28 meq/L (ref 19–32)
Calcium: 9.8 mg/dL (ref 8.4–10.5)
Chloride: 102 meq/L (ref 96–112)
Creatinine, Ser: 0.71 mg/dL (ref 0.40–1.20)
GFR: 115.78 mL/min
Glucose, Bld: 74 mg/dL (ref 70–99)
Potassium: 4.2 meq/L (ref 3.5–5.1)
Sodium: 140 meq/L (ref 135–145)
Total Bilirubin: 0.3 mg/dL (ref 0.2–1.2)
Total Protein: 7.1 g/dL (ref 6.0–8.3)

## 2025-01-24 LAB — TSH: TSH: 2.37 u[IU]/mL (ref 0.35–5.50)

## 2025-01-24 MED ORDER — SERTRALINE HCL 100 MG PO TABS: 100.0000 mg | ORAL_TABLET | Freq: Every day | ORAL | 1 refills | Status: AC

## 2025-01-24 NOTE — Assessment & Plan Note (Signed)
Followed by Dr Isenstein.  

## 2025-01-24 NOTE — Assessment & Plan Note (Signed)
 Continues diet and exercise. Continues on mounjaro - on 5mg  weekly. Check lipid panel.

## 2025-01-24 NOTE — Assessment & Plan Note (Signed)
 Doing well on mounjaro - 5mg  weekly. Continue diet and exercise. Follow.

## 2025-01-24 NOTE — Assessment & Plan Note (Signed)
Continue zoloft.  Doing well.  Follow.

## 2025-01-24 NOTE — Assessment & Plan Note (Addendum)
 Physical scheduled for today 01/24/25.  Gets breast, pelvic/pap smears through gyn. Last visit last April. Obtain records.   Check cholesterol today

## 2025-01-24 NOTE — Assessment & Plan Note (Signed)
Recently evaluated by Dr Dwain Sarna - she has CHEK2 mutation.  Father was just diagnosed with stage 4 colon cancer. Has a paternal grandmother with breast cancer (age 29) and mother with breast cancer (age 56).   Recommended to start MR screening at around age 75 - approximately 70 years younger than her mother. Discussed.  Also discussed earlier colon screening.

## 2025-01-24 NOTE — Assessment & Plan Note (Signed)
 Doing well on zoloft  100mg  q day. Continue current dose. No change in medication. Follow.

## 2025-01-24 NOTE — Assessment & Plan Note (Signed)
Check cbc today 

## 2025-01-24 NOTE — Progress Notes (Signed)
 "  Subjective:    Patient ID: Linda Chung, female    DOB: [birth year], 29 y.o.   MRN: [id number]  Patient here for  Chief Complaint  Patient presents with   Annual Exam    HPI Here for a physical exam. Continues on zoloft  - stress/anxiety. Is followed by gyn - PCOS. On continuous OCPs. No period recently. Feels good. States feel better than she has in a long time. Stays active. Tolerating mounjaro. No nausea or constipation. Breathing stable. Handling stress. Feels zoloft  working well for her. Sees [facility]. Up to date with pelvic and pap smears. Had genetic testing. Start screening - breast - age 47. Planning to get married in 02/2025.    Past Medical History:  Diagnosis Date   Allergy    Anxiety    B12 deficiency    IDA (iron deficiency anemia)    Melanoma (HCC)    OCD (obsessive compulsive disorder)    Ocular migraine    History reviewed. No pertinent surgical history. Family History  Problem Relation Age of Onset   ADD / ADHD Mother    Anxiety disorder Mother    Cancer Mother    Depression Mother    Obesity Mother    Varicose Veins Mother    ADD / ADHD Father    Cancer Father    Learning disabilities Father    ADD / ADHD Sister    Anxiety disorder Sister    Depression Sister    Social History   Socioeconomic History   Marital status: Single    Spouse name: Not on file   Number of children: Not on file   Years of education: Not on file   Highest education level: Master's degree (e.g., MA, MS, MEng, MEd, MSW, MBA)  Occupational History   Not on file  Tobacco Use   Smoking status: Never   Smokeless tobacco: Never  Vaping Use   Vaping status: Never Used  Substance and Sexual Activity   Alcohol use: Not Currently    Comment: occ   Drug use: Not Currently   Sexual activity: Not on file  Other Topics Concern   Not on file  Social History Narrative   Not on file   Social Drivers of Health   Tobacco Use: Low Risk (01/24/2025)   Patient History     Smoking Tobacco Use: Never    Smokeless Tobacco Use: Never    Passive Exposure: Not on file  Financial Resource Strain: Low Risk (10/26/2023)   Overall Financial Resource Strain (CARDIA)    Difficulty of Paying Living Expenses: Not hard at all  Food Insecurity: No Food Insecurity (10/26/2023)   Hunger Vital Sign    Worried About Running Out of Food in the Last Year: Never true    Ran Out of Food in the Last Year: Never true  Transportation Needs: No Transportation Needs (10/26/2023)   PRAPARE - Administrator, Civil Service (Medical): No    Lack of Transportation (Non-Medical): No  Physical Activity: Sufficiently Active (10/26/2023)   Exercise Vital Sign    Days of Exercise per Week: 7 days    Minutes of Exercise per Session: 150+ min  Stress: No Stress Concern Present (10/26/2023)   [employer] of Occupational Health - Occupational Stress Questionnaire    Feeling of Stress : Not at all  Social Connections: Moderately Integrated (10/26/2023)   Social Connection and Isolation Panel    Frequency of Communication with Friends and Family: More than three  times a week    Frequency of Social Gatherings with Friends and Family: Once a week    Attends Religious Services: Never    Database Administrator or Organizations: Yes    Attends Banker Meetings: 1 to 4 times per year    Marital Status: Living with partner  Depression (PHQ2-9): Low Risk (01/24/2025)   Depression (PHQ2-9)    PHQ-2 Score: 2  Alcohol Screen: Low Risk (10/26/2023)   Alcohol Screen    Last Alcohol Screening Score (AUDIT): 0  Housing: Low Risk (10/26/2023)   Housing    Last Housing Risk Score: 0  Utilities: Not on file  Health Literacy: Not on file     Review of Systems  Constitutional:  Negative for appetite change and unexpected weight change.  HENT:  Negative for congestion, sinus pressure and sore throat.   Eyes:  Negative for pain and visual disturbance.  Respiratory:   Negative for cough, chest tightness and shortness of breath.   Cardiovascular:  Negative for chest pain, palpitations and leg swelling.  Gastrointestinal:  Negative for abdominal pain, diarrhea, nausea and vomiting.  Genitourinary:  Negative for difficulty urinating and dysuria.  Musculoskeletal:  Negative for joint swelling and myalgias.  Skin:  Negative for color change and rash.  Neurological:  Negative for dizziness and headaches.  Hematological:  Negative for adenopathy. Does not bruise/bleed easily.  Psychiatric/Behavioral:  Negative for agitation and dysphoric mood.        Objective:     BP 100/70   Pulse 82   Temp 98.2 F (36.8 C) (Oral)   Ht 5' 2 (1.575 m)   Wt 133 lb (60.3 kg)   SpO2 96%   BMI 24.33 kg/m  Wt Readings from Last 3 Encounters:  01/24/25 133 lb (60.3 kg)  06/21/24 142 lb 9.6 oz (64.7 kg)  10/26/23 184 lb (83.5 kg)    Physical Exam Vitals reviewed.  Constitutional:      General: She is not in acute distress.    Appearance: Normal appearance. She is well-developed.  HENT:     Head: Normocephalic and atraumatic.     Right Ear: External ear normal.     Left Ear: External ear normal.     Mouth/Throat:     Pharynx: No oropharyngeal exudate or posterior oropharyngeal erythema.  Eyes:     General: No scleral icterus.       Right eye: No discharge.        Left eye: No discharge.     Conjunctiva/sclera: Conjunctivae normal.  Neck:     Thyroid: No thyromegaly.  Cardiovascular:     Rate and Rhythm: Normal rate and regular rhythm.  Pulmonary:     Effort: No tachypnea, accessory muscle usage or respiratory distress.     Breath sounds: Normal breath sounds. No decreased breath sounds or wheezing.  Chest:  Breasts:    Right: No inverted nipple, mass, nipple discharge or tenderness (no axillary adenopathy).     Left: No inverted nipple, mass, nipple discharge or tenderness (no axilarry adenopathy).  Abdominal:     General: Bowel sounds are normal.      Palpations: Abdomen is soft.     Tenderness: There is no abdominal tenderness.  Musculoskeletal:        General: No swelling or tenderness.     Cervical back: Neck supple.  Lymphadenopathy:     Cervical: No cervical adenopathy.  Skin:    Findings: No erythema or rash.  Neurological:  Mental Status: She is alert and oriented to person, place, and time.  Psychiatric:        Mood and Affect: Mood normal.        Behavior: Behavior normal.         Outpatient Encounter Medications as of 01/24/2025  Medication Sig   hydrOXYzine (ATARAX) 25 MG tablet Take 25 mg by mouth 2 (two)  times daily as needed for Anxiety (Anxiety/panic).   KAITLIB FE 0.8-25 MG-MCG tablet SMARTSIG:1 Tablet(s) By Mouth Daily   spironolactone (ALDACTONE) 50 MG tablet Take 50 mg by mouth 2 (two) times daily.   tirzepatide  (MOUNJARO) 7.5 MG/0.5ML Pen Inject 7.5 mg into the skin once a week. (Patient taking differently: Inject 5 mg into the skin once a week.)   valACYclovir (VALTREX) 1000 MG tablet SMARTSIG:1 Tablet(s) By Mouth Daily   sertraline  (ZOLOFT ) 100 MG tablet Take 1 tablet (100 mg total) by mouth daily.   [DISCONTINUED] sertraline  (ZOLOFT ) 100 MG tablet TAKE 1 TABLET DAILY   No facility-administered encounter medications on file as of 01/24/2025.     Lab Results  Component Value Date   WBC 6.6 06/21/2024   HGB 14.6 06/21/2024   HCT 42.4 06/21/2024   PLT 327.0 06/21/2024   GLUCOSE 76 06/21/2024   CHOL 196 06/21/2024   TRIG 130.0 06/21/2024   HDL 69.50 06/21/2024   LDLCALC 101 (H) 06/21/2024   ALT 15 06/21/2024   AST 20 06/21/2024   NA 139 06/21/2024   K 4.2 06/21/2024   CL 102 06/21/2024   CREATININE 0.82 06/21/2024   BUN 16 06/21/2024   CO2 27 06/21/2024   TSH 1.12 03/26/2023       Assessment & Plan:  Health care maintenance Assessment & Plan: Physical scheduled for today 01/24/25.  Gets breast, pelvic/pap smears through gyn. Last visit last April. Obtain records.   Check cholesterol  today    Hypercholesterolemia Assessment & Plan: Continues diet and exercise. Continues on mounjaro - on 5mg  weekly. Check lipid panel.   Orders: -     CBC with Differential/Platelet -     Comprehensive metabolic panel with GFR -     Lipid panel -     TSH  Anxiety Assessment & Plan: Doing well on zoloft  100mg  q day. Continue current dose. No change in medication. Follow.    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  Father was just diagnosed with stage 4 colon cancer. Has a paternal grandmother with breast cancer (age 63) and mother with breast cancer (age 3).   Recommended to start MR screening at around age 52 - approximately 44 years younger than her mother. Discussed.  Also discussed earlier colon screening.   History of melanoma Assessment & Plan: Followed by Dr Isenstein.     Iron deficiency anemia, unspecified iron deficiency anemia type Assessment & Plan: Check cbc today.    Obsessive-compulsive disorder, unspecified type Assessment & Plan: Continue zoloft . Doing well. Follow.    PCOS (polycystic ovarian syndrome) Assessment & Plan: Has been followed by gyn. On spironolactone. Check metabolic panel today.    Weight loss counseling, encounter for Assessment & Plan: Doing well on mounjaro - 5mg  weekly. Continue diet and exercise. Follow.    Other orders -     Sertraline  HCl; Take 1 tablet (100 mg total) by mouth daily.  Dispense: 90 tablet; Refill: 1     Allena Hamilton, MD "

## 2025-01-24 NOTE — Assessment & Plan Note (Signed)
 Has been followed by gyn. On spironolactone. Check metabolic panel today.

## 2025-06-08 ENCOUNTER — Ambulatory Visit: Admitting: Internal Medicine
